# Patient Record
Sex: Male | Born: 1950 | Race: Asian | Hispanic: No | Marital: Married | Smoking: Former smoker
Health system: Southern US, Community
[De-identification: ages and names within clinical notes are randomized; demographics above are authoritative.]

## PROBLEM LIST (undated history)

## (undated) DIAGNOSIS — N186 End stage renal disease: Secondary | ICD-10-CM

## (undated) DIAGNOSIS — N189 Chronic kidney disease, unspecified: Secondary | ICD-10-CM

## (undated) DIAGNOSIS — Z720 Tobacco use: Secondary | ICD-10-CM

## (undated) DIAGNOSIS — I10 Essential (primary) hypertension: Secondary | ICD-10-CM

## (undated) DIAGNOSIS — D649 Anemia, unspecified: Secondary | ICD-10-CM

## (undated) HISTORY — PX: NO PAST SURGERIES: SHX2092

## (undated) HISTORY — PX: EYE SURGERY: SHX253

---

## 2017-08-06 ENCOUNTER — Other Ambulatory Visit: Payer: Self-pay

## 2017-08-06 ENCOUNTER — Emergency Department (HOSPITAL_COMMUNITY): Payer: Medicare HMO

## 2017-08-06 ENCOUNTER — Encounter (HOSPITAL_COMMUNITY): Payer: Self-pay

## 2017-08-06 ENCOUNTER — Inpatient Hospital Stay (HOSPITAL_COMMUNITY)
Admission: EM | Admit: 2017-08-06 | Discharge: 2017-08-07 | DRG: 683 | Disposition: A | Discharge status: Home / Self Care | Payer: Medicare HMO | Attending: Internal Medicine | Admitting: Internal Medicine

## 2017-08-06 DIAGNOSIS — N179 Acute kidney failure, unspecified: Principal | ICD-10-CM | POA: Diagnosis present

## 2017-08-06 DIAGNOSIS — I4891 Unspecified atrial fibrillation: Secondary | ICD-10-CM | POA: Insufficient documentation

## 2017-08-06 DIAGNOSIS — F1010 Alcohol abuse, uncomplicated: Secondary | ICD-10-CM | POA: Diagnosis present

## 2017-08-06 DIAGNOSIS — N184 Chronic kidney disease, stage 4 (severe): Secondary | ICD-10-CM | POA: Diagnosis present

## 2017-08-06 DIAGNOSIS — I129 Hypertensive chronic kidney disease with stage 1 through stage 4 chronic kidney disease, or unspecified chronic kidney disease: Secondary | ICD-10-CM | POA: Diagnosis present

## 2017-08-06 DIAGNOSIS — I4892 Unspecified atrial flutter: Secondary | ICD-10-CM | POA: Diagnosis present

## 2017-08-06 DIAGNOSIS — F17210 Nicotine dependence, cigarettes, uncomplicated: Secondary | ICD-10-CM | POA: Diagnosis present

## 2017-08-06 DIAGNOSIS — I10 Essential (primary) hypertension: Secondary | ICD-10-CM | POA: Diagnosis not present

## 2017-08-06 HISTORY — DX: Tobacco use: Z72.0

## 2017-08-06 HISTORY — DX: Essential (primary) hypertension: I10

## 2017-08-06 LAB — CBC
HCT: 38 % — ABNORMAL LOW (ref 39.0–52.0)
HEMOGLOBIN: 13.1 g/dL (ref 13.0–17.0)
MCH: 32 pg (ref 26.0–34.0)
MCHC: 34.5 g/dL (ref 30.0–36.0)
MCV: 92.7 fL (ref 78.0–100.0)
Platelets: 126 10*3/uL — ABNORMAL LOW (ref 150–400)
RBC: 4.1 MIL/uL — ABNORMAL LOW (ref 4.22–5.81)
RDW: 12.7 % (ref 11.5–15.5)
WBC: 7.5 10*3/uL (ref 4.0–10.5)

## 2017-08-06 LAB — URINALYSIS, ROUTINE W REFLEX MICROSCOPIC
BACTERIA UA: NONE SEEN
Bilirubin Urine: NEGATIVE
Glucose, UA: NEGATIVE mg/dL
Ketones, ur: NEGATIVE mg/dL
Leukocytes, UA: NEGATIVE
Nitrite: NEGATIVE
PROTEIN: 100 mg/dL — AB
SPECIFIC GRAVITY, URINE: 1.004 — AB (ref 1.005–1.030)
pH: 6 (ref 5.0–8.0)

## 2017-08-06 LAB — BASIC METABOLIC PANEL
ANION GAP: 11 (ref 5–15)
BUN: 37 mg/dL — ABNORMAL HIGH (ref 6–20)
CALCIUM: 9.3 mg/dL (ref 8.9–10.3)
CO2: 22 mmol/L (ref 22–32)
Chloride: 106 mmol/L (ref 101–111)
Creatinine, Ser: 2.71 mg/dL — ABNORMAL HIGH (ref 0.61–1.24)
GFR calc Af Amer: 26 mL/min — ABNORMAL LOW (ref >60)
GFR, EST NON AFRICAN AMERICAN: 23 mL/min — AB (ref >60)
GLUCOSE: 100 mg/dL — AB (ref 65–99)
Potassium: 4.2 mmol/L (ref 3.5–5.1)
SODIUM: 139 mmol/L (ref 135–145)

## 2017-08-06 LAB — T4, FREE: FREE T4: 1.02 ng/dL (ref 0.61–1.12)

## 2017-08-06 LAB — TROPONIN I

## 2017-08-06 LAB — TSH: TSH: 4.03 u[IU]/mL (ref 0.350–4.500)

## 2017-08-06 LAB — MAGNESIUM: Magnesium: 1.7 mg/dL (ref 1.7–2.4)

## 2017-08-06 MED ORDER — HEPARIN (PORCINE) IN NACL 100-0.45 UNIT/ML-% IJ SOLN
850.0000 [IU]/h | INTRAMUSCULAR | Status: DC
  Administered 2017-08-06: 850 [IU]/h via INTRAVENOUS
  Filled 2017-08-06: qty 250

## 2017-08-06 MED ORDER — HEPARIN BOLUS VIA INFUSION
3000.0000 [IU] | Freq: Once | INTRAVENOUS | Status: AC
  Administered 2017-08-06: 3000 [IU] via INTRAVENOUS
  Filled 2017-08-06: qty 3000

## 2017-08-06 MED ORDER — CARVEDILOL 12.5 MG PO TABS
12.5000 mg | ORAL_TABLET | Freq: Two times a day (BID) | ORAL | Status: DC
  Filled 2017-08-06: qty 1

## 2017-08-06 MED ORDER — SODIUM CHLORIDE 0.9 % IV BOLUS
1000.0000 mL | Freq: Once | INTRAVENOUS | Status: AC
  Administered 2017-08-06: 1000 mL via INTRAVENOUS

## 2017-08-06 MED ORDER — METOPROLOL TARTRATE 5 MG/5ML IV SOLN
5.0000 mg | Freq: Once | INTRAVENOUS | Status: AC
  Administered 2017-08-06: 5 mg via INTRAVENOUS
  Filled 2017-08-06: qty 5

## 2017-08-06 MED ORDER — CARVEDILOL 25 MG PO TABS
25.0000 mg | ORAL_TABLET | Freq: Two times a day (BID) | ORAL | Status: DC
  Administered 2017-08-06 – 2017-08-07 (×2): 25 mg via ORAL
  Filled 2017-08-06 (×3): qty 1

## 2017-08-06 NOTE — Consult Note (Addendum)
Cardiology Consultation:   Patient IDDalvin Leblanc; [id number]; [birth year]   Admit date: 08/06/2017 Date of Consult: 08/06/2017  Primary Care Provider: No primary care provider on file. Primary Cardiologist: David Latch, MD  Primary Electrophysiologist:     Patient Profile:   David Leblanc is a 67 y.o. male with a hx of HTN and current smoker who is being seen today for the evaluation of abnormal EKG at the request of Dr. Gilford Leblanc.  History of Present Illness:   Mr. David Leblanc has not seen a provider in over two years. At the urging of his sister, he presented for a routine physical to PCP to establish care. The PCP found him to be tachycardic and with an abnormal EKG and sent him to [facility] for further evaluation.  On arrival, he is completely asymptomatic with a heart rate in the 100-130s. EKG and telemetry with 2:1 atrial flutter. EDP ordered 5 mg IV lopressor which did not significantly slow his rate. He denies chest pain, shortness of breath, bleeding problems, orthopnea, lower extremity swelling, dizziness, recent falls and syncopal episodes.   He smokes cigarettes. He has a history of taking antihypertensives, but stopped taking his medication and following up with a provider over the past 2 years. Electrolytes are WNL, TSH high normal, Mg pending. He has an elevated creatinine to 2.71. After calling his PCP, he has had elevated creatinine in the past (? 1.9).  CXR with cardiomegaly.    Past Medical History:  Diagnosis Date  . Hypertension   . Tobacco use     History reviewed. No pertinent surgical history.   Home Medications:  Prior to Admission medications   Medication Sig Start Date End Date Taking? Authorizing Provider  Multiple Vitamin (MULTIVITAMIN WITH MINERALS) TABS tablet Take 1 tablet by mouth daily.   Yes [provider]    Inpatient Medications: Scheduled Meds:  Continuous Infusions:  PRN Meds:   Allergies:   No Known Allergies  Social  History:   Social History   Socioeconomic History  . Marital status: Not on file    Spouse name: Not on file  . Number of children: Not on file  . Years of education: Not on file  . Highest education level: Not on file  Occupational History  . Not on file  Social Needs  . Financial resource strain: Not on file  . Food insecurity:    Worry: Not on file    Inability: Not on file  . Transportation needs:    Medical: Not on file    Non-medical: Not on file  Tobacco Use  . Smoking status: Former Smoker    Packs/day: 0.50    Years: 50.00    Pack years: 25.00    Types: Cigarettes  . Smokeless tobacco: Never Used  Substance and Sexual Activity  . Alcohol use: Not on file  . Drug use: Not on file  . Sexual activity: Not on file  Lifestyle  . Physical activity:    Days per week: Not on file    Minutes per session: Not on file  . Stress: Not on file  Relationships  . Social connections:    Talks on phone: Not on file    Gets together: Not on file    Attends religious service: Not on file    Active member of club or organization: Not on file    Attends meetings of clubs or organizations: Not on file    Relationship status: Not on file  . Intimate partner  violence:    Fear of current or ex partner: Not on file    Emotionally abused: Not on file    Physically abused: Not on file    Forced sexual activity: Not on file  Other Topics Concern  . Not on file  Social History Narrative  . Not on file    Family History:    Family History  Problem Relation Age of Onset  . Hypertension Father      ROS:  Please see the history of present illness.  All other ROS reviewed and negative.     Physical Exam/Data:   Vitals:   08/06/17 1530 08/06/17 1536 08/06/17 1545 08/06/17 1547  BP: (!) 197/116 (!) 170/108 (!) 198/110 (!) 172/100  Pulse: (!) 108 (!) 49 (!) 112 (!) 50  Resp: 19 (!) 22 (!) 24 (!) 27  Temp:      TempSrc:      SpO2: 100% 99% 99% 100%  Weight:      Height:        No intake or output data in the 24 hours ending 08/06/17 1605 Filed Weights   08/06/17 1350  Weight: 128 lb (58.1 kg)   Body mass index is 20.98 kg/m.  General:  Well nourished, well developed, in no acute distress HEENT: normal Neck: no JVD Vascular: No carotid bruits Cardiac:  Irregular rhythm, tachycardic rate Lungs:  clear to auscultation bilaterally, no wheezing, rhonchi or rales  Abd: soft, nontender, no hepatomegaly  Ext: no edema Musculoskeletal:  No deformities, BUE and BLE strength normal and equal Skin: warm and dry  Neuro:  CNs 2-12 intact, no focal abnormalities noted Psych:  Normal affect   EKG:  The EKG was personally reviewed and demonstrates:  Atrial flutter with RVR Telemetry:  Telemetry was personally reviewed and demonstrates:  Aflutter  Relevant CV Studies:  Echo pending  Laboratory Data:  Chemistry Recent Labs  Lab 08/06/17 1436  NA 139  K 4.2  CL 106  CO2 22  GLUCOSE 100*  BUN 37*  CREATININE 2.71*  CALCIUM 9.3  GFRNONAA 23*  GFRAA 26*  ANIONGAP 11    No results for input(s): PROT, ALBUMIN, AST, ALT, ALKPHOS, BILITOT in the last 168 hours. Hematology Recent Labs  Lab 08/06/17 1436  WBC 7.5  RBC 4.10*  HGB 13.1  HCT 38.0*  MCV 92.7  MCH 32.0  MCHC 34.5  RDW 12.7  PLT 126*   Cardiac Enzymes Recent Labs  Lab 08/06/17 1427  TROPONINI <0.03   No results for input(s): TROPIPOC in the last 168 hours.  BNPNo results for input(s): BNP, PROBNP in the last 168 hours.  DDimer No results for input(s): DDIMER in the last 168 hours.  Radiology/Studies:  No results found.  Assessment and Plan:   1. Atrial flutter with RVR - pt presents with atrial flutter with RVR - he is asymptomatic and tolerating this rhythm well, HDS - unknown how long he has been in this rhythm - K and TSH normal - Mg pending - will order echocardiogram - this will guide HTN medications as well - This patients CHA2DS2-VASc Score (is at least 2) and  unadjusted Ischemic Stroke Rate (% per year) is equal to  2.2 % stroke rate/year from a score of 2 (HTN, age) - will start heparin drip - pt has since converted to NSR following IV lopressor x 1 - in anticipation of hypertensive heart disease and cardiomyopathy, will start coreg 25 mg BID - pending echo results, may need  an ischemic workup   2. Essential hypertension - pressures in the 190s - 5 mg IV lopressor dropped pressure initially to 013H systolic - will avoid ACEI/ARB during workup of CKD, as below   3. Suspect acute on chronic kidney disease - baseline creatinine is unknown - per EDP who called PCP office, two years ago his creatinine was 1.9 - avoid nephrotoxic agents  Recommend admission for overnight observation to make sure he remains in NSR or rate-controlled on current regimen. His reduced kidney function may make anticoagulation challenging. If kidney function improves, may be a candidate for NOAC. Cardiology will follow.   For questions or updates, please contact [facility] Please consult [email or web address] for contact info under Cardiology/STEMI.   Signed, Tami Lin Anguel Delapena, PA  08/06/2017 4:05 PM

## 2017-08-06 NOTE — ED Notes (Signed)
CARDIO PA Provider at bedside. WITNESSED LOPRESSOR MEDICATION ADMINISTRATION. REQUESTING EKG

## 2017-08-06 NOTE — ED Notes (Signed)
ED Provider at bedside. [place] AT BEDSIDE SPEAKING TO PT AND FAMILY

## 2017-08-06 NOTE — ED Triage Notes (Addendum)
Patient presents from PCP's office for yearly check up. Patient reports EKG showed "irregular heart rhythm" and patient was encouraged to go to [facility] for further work up. Patient denies CP/SOB. Patient AxOx4 in triage. Patient denies heart history. EKG in triage shows Atrial Fibrillation with RVR.

## 2017-08-06 NOTE — Progress Notes (Signed)
ANTICOAGULATION CONSULT NOTE - Initial Consult  Pharmacy Consult for Heparin Indication: atrial fibrillation  No Known Allergies  Patient Measurements: Height: 5' 5.5" (166.4 cm) Weight: 128 lb (58.1 kg) IBW/kg (Calculated) : 62.65 Heparin Dosing Weight: 58 kg  Vital Signs: Temp: 98.3 F (36.8 C) (04/18 1350) Temp Source: Oral (04/18 1350) BP: 182/100 (04/18 1630) Pulse Rate: 89 (04/18 1630)  Labs: Recent Labs    08/06/17 1427 08/06/17 1436  HGB  --  13.1  HCT  --  38.0*  PLT  --  126*  CREATININE  --  2.71*  TROPONINI <0.03  --     Estimated Creatinine Clearance: 21.4 mL/min (A) (by C-G formula based on SCr of 2.71 mg/dL (H)).   Medical History: Past Medical History:  Diagnosis Date  . Hypertension   . Tobacco use      Assessment: 65 YOM presents to PCP visit (establishing care as last time saw physician was ~2y ago) and found to be tachycardic w/ abnormal EKG. Sent to [facility] ED and found to have aflutter with RVR. Pharmacy asked to dose heparin gtt.    Today, 08/06/2017  CBC: Hgb WNL, platelets mildly decreased (no previous labs seen in [facility])  Renal: SCr elevated  No reportedly on antithrombotics prior to admission   Goal of Therapy:  Heparin level 0.3-0.7 units/ml Monitor platelets by anticoagulation protocol: Yes   Plan:   Heparin 3000 unit bolus then 850 units/hr infusion   8h heparin level (also check INR)  Daily heparin level and CBC  Monitor platelet count  Monitor for bleeding  Await plan for long-term anticoagulation for stroke prevention   DOAC not best option based on current renal fx at time of presentation   Doreene Eland, PharmD, BCPS.   Pager: [phone] 08/06/2017 5:13 PM

## 2017-08-06 NOTE — ED Notes (Signed)
ADMISSION RN AT BEDSIDE 

## 2017-08-06 NOTE — ED Notes (Signed)
Patient transported to X-ray 

## 2017-08-06 NOTE — ED Notes (Signed)
NSR EKG GIVEN TO EDPA EMILY SHROSBREE. NO ADDITIONAL ORDERS GIVEN

## 2017-08-06 NOTE — ED Notes (Signed)
Bed: YY34 Expected date:  Expected time:  Means of arrival:  Comments: TRI 4

## 2017-08-06 NOTE — ED Provider Notes (Signed)
[facility] DEPT Provider Note   CSN: [id number] Arrival date & time: 08/06/17  1342     History   Chief Complaint Chief Complaint  Patient presents with  . Irregular Heart Beat    HPI Abdimalik Rica Koyanagi is a 67 y.o. male.  HPI  [name] is a 67 year old male with history of hypertension and tobacco use who presents to the emergency department from his PCP office for evaluation of irregular heart rhythm.  Patient reports that he is otherwise in his normal state of health and feels baseline, but was told to come here for irregular heart rhythm.  He denies chest pain, shortness of breath, palpitations, leg swelling, recent illness with cough, fever, sore throat or congestion, lightheadedness or syncope.  States that he has never been diagnosed with irregular heartbeat in the past.  Drinks about 1 cup of coffee per day.  Also states that he has 2 shots of alcohol per day, denies any recent increase in alcohol consumption or recreational drug use.  States that he has not taken antihypertensives in two years now, nor does he take any medication regularly other than a multivitamin.  He denies history of exertional chest pain.  Reports that his father had a heart attack at age 40, no other family history of ischemic heart disease.  Past Medical History:  Diagnosis Date  . Hypertension     There are no active problems to display for this patient.   History reviewed. No pertinent surgical history.      Home Medications    Prior to Admission medications   Not on File    Family History No family history on file.  Social History Social History   Tobacco Use  . Smoking status: Former Smoker    Packs/day: 0.50    Years: 50.00    Pack years: 25.00    Types: Cigarettes  . Smokeless tobacco: Never Used  Substance Use Topics  . Alcohol use: Not on file  . Drug use: Not on file     Allergies   Patient has no known allergies.   Review of  Systems Review of Systems  Constitutional: Negative for chills and fever.  HENT: Negative for congestion, ear pain and sore throat.   Eyes: Negative for visual disturbance.  Respiratory: Negative for cough and shortness of breath.   Cardiovascular: Negative for chest pain, palpitations and leg swelling.  Gastrointestinal: Negative for abdominal pain, nausea and vomiting.  Genitourinary: Negative for difficulty urinating.  Musculoskeletal: Negative for gait problem.  Skin: Negative for rash.  Neurological: Negative for dizziness, weakness, light-headedness, numbness and headaches.  Psychiatric/Behavioral: Negative for agitation.     Physical Exam Updated Vital Signs BP (!) 162/90 (BP Location: Left Arm)   Pulse 92   Temp 98.3 F (36.8 C) (Oral)   Resp (!) 22   Ht 5' 5.5" (1.664 m)   Wt 58.1 kg (128 lb)   SpO2 100%   BMI 20.98 kg/m   Physical Exam  Constitutional: He is oriented to person, place, and time. He appears well-developed and well-nourished. No distress.  Sitting at bedside in no apparent distress. Non-toxic.   HENT:  Head: Normocephalic and atraumatic.  Mouth/Throat: Oropharynx is clear and moist. No oropharyngeal exudate.  Eyes: Pupils are equal, round, and reactive to light. Right eye exhibits no discharge. Left eye exhibits no discharge.  Neck: Normal range of motion. Neck supple. No JVD present. No tracheal deviation present.  Cardiovascular:  No murmur heard. Tachycardic,  irregular rhythm  Pulmonary/Chest: Effort normal and breath sounds normal. No stridor. No respiratory distress. He has no wheezes. He has no rales.  Abdominal: Soft. Bowel sounds are normal. There is no tenderness. There is no guarding.  Musculoskeletal:  No leg swelling.   Neurological: He is alert and oriented to person, place, and time. Coordination normal.  Skin: Skin is warm and dry. Capillary refill takes less than 2 seconds. He is not diaphoretic.  Psychiatric: He has a normal mood  and affect. His behavior is normal.  Nursing note and vitals reviewed.    ED Treatments / Results  Labs (all labs ordered are listed, but only abnormal results are displayed) Labs Reviewed  BASIC METABOLIC PANEL  CBC  TROPONIN I  TSH  T4, FREE    EKG EKG Interpretation  Date/Time:  Thursday August 06 2017 13:54:05 EDT Ventricular Rate:  128 PR Interval:    QRS Duration: 102 QT Interval:  323 QTC Calculation: 472 R Axis:   -62 Text Interpretation:  Sinus tachycardia with irregular rate LAD, consider left anterior fascicular block LVH with secondary repolarization abnormality No old tracing to compare Confirmed by Isla Pence [phone]) on 08/06/2017 2:25:11 PM   Radiology No results found.  Procedures Procedures (including critical care time)  Medications Ordered in ED Medications  carvedilol (COREG) tablet 25 mg (25 mg Oral Given 08/06/17 1807)  heparin ADULT infusion 100 units/mL (25000 units/215mL sodium chloride 0.45%) (850 Units/hr Intravenous Canceled Entry 08/06/17 1821)  metoprolol tartrate (LOPRESSOR) injection 5 mg (5 mg Intravenous Given 08/06/17 1533)  sodium chloride 0.9 % bolus 1,000 mL (0 mLs Intravenous Stopped 08/06/17 1602)  heparin bolus via infusion 3,000 Units (3,000 Units Intravenous Bolus from Bag 08/06/17 1816)     Initial Impression / Assessment and Plan / ED Course  I have reviewed the triage vital signs and the nursing notes.  Pertinent labs & imaging results that were available during my care of the patient were reviewed by me and considered in my medical decision making (see chart for details).     Patient presents with irregular heartbeat from PCP office.  He denies any symptoms.  He is initially tachycardic with heart rate 120-140bpm, rhythm irregular.   EKG with irregular rhythm, p-waves present. He is otherwise hemodynamically stable. Blood pressure 162/90. IV fluids and metoprolol ordered. Discussed this patient with Dr. Gilford Raid who  agrees with this plan. Plan to get basic labs including troponin and cardiology consult.   BMP without any major electrolyte abnormalities.  His creatinine is elevated at 2.71.  Contacted patient's prior PCP who reports his creatinine was 1.98 in 2017.  Suspect that this is a chronic problem related to his untreated hypertension.  CBC without acute abnormality.  Troponin negative, EKG nonischemic and given presentation do not suspect ACS.  Cardiology consulted.  PA Fabian Sharp saw the patient and will admit. She suggests starting heparin.   Several hours later patient continues to be in the ER with bed request pending. No H&P in the chart, only consult note by cardiology. There was an error in communication, apparently Cardiology would like medicine to admit. Hospitalist service consulted. Spoke with Hospitalist Dr. Maudie Mercury who will admit patient.   Final Clinical Impressions(s) / ED Diagnoses   Final diagnoses:  None    ED Discharge Orders    None       Bernarda Caffey 08/07/17 Dreama Saa, MD 08/14/17 [phone]

## 2017-08-07 ENCOUNTER — Inpatient Hospital Stay (HOSPITAL_COMMUNITY): Payer: Medicare HMO

## 2017-08-07 ENCOUNTER — Encounter (HOSPITAL_COMMUNITY): Payer: Self-pay | Admitting: Internal Medicine

## 2017-08-07 DIAGNOSIS — N184 Chronic kidney disease, stage 4 (severe): Secondary | ICD-10-CM

## 2017-08-07 DIAGNOSIS — I129 Hypertensive chronic kidney disease with stage 1 through stage 4 chronic kidney disease, or unspecified chronic kidney disease: Secondary | ICD-10-CM | POA: Diagnosis present

## 2017-08-07 DIAGNOSIS — I4892 Unspecified atrial flutter: Secondary | ICD-10-CM | POA: Diagnosis present

## 2017-08-07 DIAGNOSIS — N179 Acute kidney failure, unspecified: Secondary | ICD-10-CM | POA: Diagnosis present

## 2017-08-07 DIAGNOSIS — F17210 Nicotine dependence, cigarettes, uncomplicated: Secondary | ICD-10-CM | POA: Diagnosis present

## 2017-08-07 DIAGNOSIS — F1010 Alcohol abuse, uncomplicated: Secondary | ICD-10-CM | POA: Diagnosis present

## 2017-08-07 DIAGNOSIS — I10 Essential (primary) hypertension: Secondary | ICD-10-CM | POA: Diagnosis not present

## 2017-08-07 LAB — ECHOCARDIOGRAM COMPLETE
Height: 65.5 in
Weight: 2049.6 oz

## 2017-08-07 LAB — CBC
HEMATOCRIT: 35.3 % — AB (ref 39.0–52.0)
HEMOGLOBIN: 12.1 g/dL — AB (ref 13.0–17.0)
MCH: 31.9 pg (ref 26.0–34.0)
MCHC: 34.3 g/dL (ref 30.0–36.0)
MCV: 93.1 fL (ref 78.0–100.0)
Platelets: 122 10*3/uL — ABNORMAL LOW (ref 150–400)
RBC: 3.79 MIL/uL — AB (ref 4.22–5.81)
RDW: 12.8 % (ref 11.5–15.5)
WBC: 6.1 10*3/uL (ref 4.0–10.5)

## 2017-08-07 LAB — COMPREHENSIVE METABOLIC PANEL
ALT: 25 U/L (ref 17–63)
ANION GAP: 8 (ref 5–15)
AST: 23 U/L (ref 15–41)
Albumin: 3.3 g/dL — ABNORMAL LOW (ref 3.5–5.0)
Alkaline Phosphatase: 58 U/L (ref 38–126)
BUN: 33 mg/dL — ABNORMAL HIGH (ref 6–20)
CHLORIDE: 109 mmol/L (ref 101–111)
CO2: 22 mmol/L (ref 22–32)
CREATININE: 2.51 mg/dL — AB (ref 0.61–1.24)
Calcium: 8.9 mg/dL (ref 8.9–10.3)
GFR, EST AFRICAN AMERICAN: 29 mL/min — AB (ref >60)
GFR, EST NON AFRICAN AMERICAN: 25 mL/min — AB (ref >60)
Glucose, Bld: 110 mg/dL — ABNORMAL HIGH (ref 65–99)
Potassium: 4.5 mmol/L (ref 3.5–5.1)
Sodium: 139 mmol/L (ref 135–145)
Total Bilirubin: 1.3 mg/dL — ABNORMAL HIGH (ref 0.3–1.2)
Total Protein: 6.7 g/dL (ref 6.5–8.1)

## 2017-08-07 LAB — HEPARIN LEVEL (UNFRACTIONATED)
HEPARIN UNFRACTIONATED: 0.54 [IU]/mL (ref 0.30–0.70)
Heparin Unfractionated: 0.2 IU/mL — ABNORMAL LOW (ref 0.30–0.70)

## 2017-08-07 LAB — SODIUM, URINE, RANDOM: SODIUM UR: 35 mmol/L

## 2017-08-07 LAB — PROTIME-INR
INR: 1.16
PROTHROMBIN TIME: 14.7 s (ref 11.4–15.2)

## 2017-08-07 LAB — TROPONIN I: Troponin I: <0.03 ng/mL (ref <0.03)

## 2017-08-07 MED ORDER — AMLODIPINE BESYLATE 5 MG PO TABS
5.0000 mg | ORAL_TABLET | Freq: Every day | ORAL | Status: DC
  Administered 2017-08-07: 5 mg via ORAL
  Filled 2017-08-07: qty 1

## 2017-08-07 MED ORDER — APIXABAN 2.5 MG PO TABS
2.5000 mg | ORAL_TABLET | Freq: Two times a day (BID) | ORAL | Status: DC
  Administered 2017-08-07: 2.5 mg via ORAL
  Filled 2017-08-07: qty 1

## 2017-08-07 MED ORDER — APIXABAN 2.5 MG PO TABS: 2.5000 mg | ORAL_TABLET | Freq: Two times a day (BID) | ORAL | 0 refills | Status: DC

## 2017-08-07 MED ORDER — ACETAMINOPHEN 650 MG RE SUPP: 650.0000 mg | Freq: Four times a day (QID) | RECTAL | Status: DC | PRN

## 2017-08-07 MED ORDER — CARVEDILOL 25 MG PO TABS: 25.0000 mg | ORAL_TABLET | Freq: Two times a day (BID) | ORAL | 0 refills | Status: DC

## 2017-08-07 MED ORDER — ACETAMINOPHEN 325 MG PO TABS: 650.0000 mg | ORAL_TABLET | Freq: Four times a day (QID) | ORAL | Status: DC | PRN

## 2017-08-07 MED ORDER — SODIUM CHLORIDE 0.9 % IV SOLN
INTRAVENOUS | Status: AC
  Administered 2017-08-07: 03:00:00 via INTRAVENOUS

## 2017-08-07 MED ORDER — AMLODIPINE BESYLATE 5 MG PO TABS: 5.0000 mg | ORAL_TABLET | Freq: Every day | ORAL | 0 refills | Status: DC

## 2017-08-07 NOTE — Progress Notes (Signed)
Pt received Eliquis card and instructions from Pharm D.

## 2017-08-07 NOTE — Discharge Summary (Signed)
Physician Discharge Summary  David Leblanc NIO:[id number] DOB: 05/08/1950 DOA: 08/06/2017  PCP: System, Pcp Not In  Admit date: 08/06/2017 Discharge date: 08/07/2017  Admitted From: Home Disposition: Home  Discharge Condition:Stable CODE STATUS:Full Diet recommendation: Heart Healthy   Brief/Interim Summary:  Admission H and P:  David Leblanc  is a 67 y.o. male,w hypertension apparently went to pcp for a routine exam and was noted to be in Afib with RVR w palpitations.  Pt presented to ED for evaluation and received metoprolol iv without improvement. And then had spontaneous conversion to NSR as well as ARF on CRF.    In Ed,  Trop <0.03 Magnesium 1.7 Na 139, K 4.2, Bun 37, creatinine 2.71 Wbc 7.5, Hgb 13.1, Plt 126 TSH 4.030 Urinalysis wbc 0--5 Pt will be admitted for aflutter w RVR.  Hospital course:  Patient was noted to be in atrial flutter with RVR on presentation.  Patient denies any past medical history.  He has not seen a doctor for about 2 years.  He converted to normal sinus rhythm after he was started on carvedilol 25 mg twice a day.  He was also initially started on heparin drip .Cardiology was consulted. Patient was also found to have abnormal kidney function.  Could be acute kidney injury versus chronic kidney disease.  Since he presented with hypertension and he was not taking any medications at home, we presume that this could be CKD associated  with uncontrolled hypertension.  We cannot find baseline creatinine on his chart and he denies any history of kidney problems in the past. Patient seen and examined this morning.  Remains very comfortable.  He converted to normal sinus rhythm after starting on carvedilol.  His blood pressure this morning is stable. He underwent echocardiogram which showed normal left jugular size and function, grade 2 diastolic dysfunction.  Cardiology cleared him for discharge. Heparin drip discontinued and he was started on Eliquis 2.5 mg  daily. Since patient rapidly improved overnight, he is stable for discharge today.  He should follow-up with cardiology outpatient.  He is also follow-up with nephrology as an outpatient in 2 weeks.  We recommend  to get a BMP test in a week to check his kidney function.  Following problems were addressed during hospitalization:  Afib/flutter with RVR: Currently normal sinus rhythm.  Troponin negative.  Echocardiography showed normal left ventricle size and function, grade 2 diastolic dysfunction. Initially started on heparin drip which has been discontinued. Continue Coreg.  Continue Eliquis which has been started here Case manager consulted for providing coupon for Eliquis.  Acute kidney injury on CKD versus CKD: Baseline creatinine unknown.  Likely chronic kidney disease associated with uncontrolled hypertension. Follow-up with nephrology as an outpatient. Kidney function slightly improved this morning with IV fluids. Renal ultrasound showed mildly increased renal parenchymal echogenicity may reflect medical renal disease.  Hypertension: Currently blood pressure stable.  Continue carvedilol and amlodipine.  Discharge Diagnoses:  Principal Problem:   Atrial flutter ([place]) Active Problems:   Acute renal failure superimposed on stage 4 chronic kidney disease (HCC)   Essential hypertension   Atrial flutter with rapid ventricular response [facility])    Discharge Instructions  Discharge Instructions    Diet - low sodium heart healthy   Complete by:  As directed    Discharge instructions   Complete by:  As directed    1) Please follow up with your PCP/cardiology as an outpatient in a week. 2) Do a BMP to check kidney function in a week  during the follow-up. 3) Follow up with nephrology in 2 weeks.  Name and number of the provider has been attached. 4) Take prescribed medications as instructed.   Increase activity slowly   Complete by:  As directed      Allergies as of 08/07/2017   No  Known Allergies     Medication List    TAKE these medications   amLODipine 5 MG tablet Commonly known as:  NORVASC Take 1 tablet (5 mg total) by mouth daily. Start taking on:  08/08/2017   apixaban 2.5 MG Tabs tablet Commonly known as:  ELIQUIS Take 1 tablet (2.5 mg total) by mouth 2 (two) times daily.   carvedilol 25 MG tablet Commonly known as:  COREG Take 1 tablet (25 mg total) by mouth 2 (two) times daily with a meal.   multivitamin with minerals Tabs tablet Take 1 tablet by mouth daily.      Follow-up Information    Josue Hector, MD Follow up on 08/28/2017.   Specialty:  Cardiology Why:  at 1:45 PM with  Lewayne Bunting, NP Contact information: [address] [place] [address] [phone]        Rosita Fire, MD. Schedule an appointment as soon as possible for a visit in 2 week(s).   Specialty:  Internal Medicine Contact information: [place] [address] [phone]          No Known Allergies  Consultations:  Cardiology   Procedures/Studies: Dg Chest 2 View  Result Date: 08/06/2017 CLINICAL DATA:  Yearly checkup. Atrial fibrillation. Irregular heartbeat. EXAM: CHEST - 2 VIEW COMPARISON:  None. FINDINGS: Cardiomegaly. Aortic atherosclerosis. Clear lung fields. No bony abnormality. IMPRESSION: Cardiomegaly.  No active disease. Electronically Signed   By: Staci Righter M.D.   On: 08/06/2017 16:05   US Renal  Result Date: 08/07/2017 CLINICAL DATA:  Acute onset of renal failure. EXAM: RENAL / URINARY TRACT ULTRASOUND COMPLETE COMPARISON:  None. FINDINGS: Right Kidney: Length: 8.7 cm. Mildly increased parenchymal echogenicity is noted. Several right renal cysts are seen, measuring up to 2.1 cm in size. One of these appears septated. No hydronephrosis visualized. Left Kidney: Length: 8.8 cm. Mildly increased parenchymal echogenicity is noted. Several left renal cysts are seen, measuring up to 1.2 cm in size. No  hydronephrosis visualized. Bladder: Appears normal for degree of bladder distention. IMPRESSION: 1. No evidence of hydronephrosis. 2. Mildly increased renal parenchymal echogenicity may reflect medical renal disease. 3. Bilateral renal cysts noted. Electronically Signed   By: Garald Balding M.D.   On: 08/07/2017 04:21    echo  Subjective: Patient seen and examined the patient this morning.  He was comfortable.  Hemodynamically stable.  Stable for discharge today.  Discharge Exam: Vitals:   08/07/17 0239 08/07/17 1334  BP: (!) 168/79 (!) 141/72  Pulse: 70 76  Resp: 12 16  Temp: 98.4 F (36.9 C) 99.2 F (37.3 C)  SpO2: 98% 99%   Vitals:   08/07/17 0100 08/07/17 0130 08/07/17 0239 08/07/17 1334  BP: (!) 164/89 (!) 162/86 (!) 168/79 (!) 141/72  Pulse: 72 70 70 76  Resp: 14 13 12 16   Temp:   98.4 F (36.9 C) 99.2 F (37.3 C)  TempSrc:   Oral Oral  SpO2: 100% 100% 98% 99%  Weight:   58.1 kg (128 lb 1.6 oz)   Height:   5' 5.5" (1.664 m)     General: Pt is alert, awake, not in acute distress Cardiovascular: RRR,  S1/S2 +, no rubs, no gallops Respiratory: CTA bilaterally, no wheezing, no rhonchi Abdominal: Soft, NT, ND, bowel sounds + Extremities: no edema, no cyanosis    The results of significant diagnostics from this hospitalization (including imaging, microbiology, ancillary and laboratory) are listed below for reference.     Microbiology: No results found for this or any previous visit (from the past 240 hour(s)).   Labs: BNP (last 3 results) No results for input(s): BNP in the last 8760 hours. Basic Metabolic Panel: Recent Labs  Lab 08/06/17 1434 08/06/17 1436 08/07/17 0233  NA  --  139 139  K  --  4.2 4.5  CL  --  106 109  CO2  --  22 22  GLUCOSE  --  100* 110*  BUN  --  37* 33*  CREATININE  --  2.71* 2.51*  CALCIUM  --  9.3 8.9  MG 1.7  --   --    Liver Function Tests: Recent Labs  Lab 08/07/17 0233  AST 23  ALT 25  ALKPHOS 58  BILITOT 1.3*   PROT 6.7  ALBUMIN 3.3*   No results for input(s): LIPASE, AMYLASE in the last 168 hours. No results for input(s): AMMONIA in the last 168 hours. CBC: Recent Labs  Lab 08/06/17 1436 08/07/17 0233  WBC 7.5 6.1  HGB 13.1 12.1*  HCT 38.0* 35.3*  MCV 92.7 93.1  PLT 126* 122*   Cardiac Enzymes: Recent Labs  Lab 08/06/17 1427 08/07/17 0235 08/07/17 0921  TROPONINI <0.03 <0.03 <0.03   BNP: Invalid input(s): POCBNP CBG: No results for input(s): GLUCAP in the last 168 hours. D-Dimer No results for input(s): DDIMER in the last 72 hours. Hgb A1c No results for input(s): HGBA1C in the last 72 hours. Lipid Profile No results for input(s): CHOL, HDL, LDLCALC, TRIG, CHOLHDL, LDLDIRECT in the last 72 hours. Thyroid function studies Recent Labs    08/06/17 1436  TSH 4.030   Anemia work up No results for input(s): VITAMINB12, FOLATE, FERRITIN, TIBC, IRON, RETICCTPCT in the last 72 hours. Urinalysis    Component Value Date/Time   COLORURINE STRAW (A) 08/06/2017 1619   APPEARANCEUR CLEAR 08/06/2017 1619   LABSPEC 1.004 (L) 08/06/2017 1619   PHURINE 6.0 08/06/2017 1619   GLUCOSEU NEGATIVE 08/06/2017 1619   HGBUR SMALL (A) 08/06/2017 1619   BILIRUBINUR NEGATIVE 08/06/2017 1619   KETONESUR NEGATIVE 08/06/2017 1619   PROTEINUR 100 (A) 08/06/2017 1619   NITRITE NEGATIVE 08/06/2017 1619   LEUKOCYTESUR NEGATIVE 08/06/2017 1619   Sepsis Labs Invalid input(s): PROCALCITONIN,  WBC,  LACTICIDVEN Microbiology No results found for this or any previous visit (from the past 240 hour(s)).   Time coordinating discharge: 35 minutes  SIGNED:   Shelly Coss, MD  [facility] 08/07/2017, 2:27 PM Pager [phone]  If 7PM-7AM, please contact night-coverage [email or web address] Password [email or web address]

## 2017-08-07 NOTE — Progress Notes (Signed)
Progress Note  Patient Name: David Leblanc Date of Encounter: 08/07/2017  Primary Cardiologist: Skeet Latch, MD   Subjective   No chest pain, no SOB family with pt.   Inpatient Medications    Scheduled Meds: . amLODipine  5 mg Oral Daily  . carvedilol  25 mg Oral BID WC   Continuous Infusions: . sodium chloride 75 mL/hr at 08/07/17 0300  . heparin 850 Units/hr (08/06/17 1819)   PRN Meds: acetaminophen **OR** acetaminophen   Vital Signs    Vitals:   08/07/17 0030 08/07/17 0100 08/07/17 0130 08/07/17 0239  BP: (!) 160/86 (!) 164/89 (!) 162/86 (!) 168/79  Pulse: 66 72 70 70  Resp: 18 14 13 12   Temp:    98.4 F (36.9 C)  TempSrc:    Oral  SpO2: 99% 100% 100% 98%  Weight:    128 lb 1.6 oz (58.1 kg)  Height:    5' 5.5" (1.664 m)    Intake/Output Summary (Last 24 hours) at 08/07/2017 0958 Last data filed at 08/07/2017 0754 Gross per 24 hour  Intake 1244.31 ml  Output 100 ml  Net 1144.31 ml   Filed Weights   08/06/17 1350 08/07/17 0239  Weight: 128 lb (58.1 kg) 128 lb 1.6 oz (58.1 kg)    Telemetry    SR  - Personally Reviewed  ECG    No new - Personally Reviewed  Physical Exam   GEN: No acute distress.   Neck: No JVD Cardiac: RRR, no murmurs, rubs, or gallops.  Respiratory: breath sounds to auscultation bilaterally few wheezes. GI: Soft, nontender, non-distended  MS: No edema; No deformity. Neuro:  Nonfocal  Psych: Normal affect   Labs    Chemistry Recent Labs  Lab 08/06/17 1436 08/07/17 0233  NA 139 139  K 4.2 4.5  CL 106 109  CO2 22 22  GLUCOSE 100* 110*  BUN 37* 33*  CREATININE 2.71* 2.51*  CALCIUM 9.3 8.9  PROT  --  6.7  ALBUMIN  --  3.3*  AST  --  23  ALT  --  25  ALKPHOS  --  58  BILITOT  --  1.3*  GFRNONAA 23* 25*  GFRAA 26* 29*  ANIONGAP 11 8     Hematology Recent Labs  Lab 08/06/17 1436 08/07/17 0233  WBC 7.5 6.1  RBC 4.10* 3.79*  HGB 13.1 12.1*  HCT 38.0* 35.3*  MCV 92.7 93.1  MCH 32.0 31.9  MCHC 34.5  34.3  RDW 12.7 12.8  PLT 126* 122*    Cardiac Enzymes Recent Labs  Lab 08/06/17 1427 08/07/17 0235  TROPONINI <0.03 <0.03   No results for input(s): TROPIPOC in the last 168 hours.   BNPNo results for input(s): BNP, PROBNP in the last 168 hours.   DDimer No results for input(s): DDIMER in the last 168 hours.   Radiology    Dg Chest 2 View  Result Date: 08/06/2017 CLINICAL DATA:  Yearly checkup. Atrial fibrillation. Irregular heartbeat. EXAM: CHEST - 2 VIEW COMPARISON:  None. FINDINGS: Cardiomegaly. Aortic atherosclerosis. Clear lung fields. No bony abnormality. IMPRESSION: Cardiomegaly.  No active disease. Electronically Signed   By: Staci Righter M.D.   On: 08/06/2017 16:05   US Renal  Result Date: 08/07/2017 CLINICAL DATA:  Acute onset of renal failure. EXAM: RENAL / URINARY TRACT ULTRASOUND COMPLETE COMPARISON:  None. FINDINGS: Right Kidney: Length: 8.7 cm. Mildly increased parenchymal echogenicity is noted. Several right renal cysts are seen, measuring up to 2.1 cm in size. One  of these appears septated. No hydronephrosis visualized. Left Kidney: Length: 8.8 cm. Mildly increased parenchymal echogenicity is noted. Several left renal cysts are seen, measuring up to 1.2 cm in size. No hydronephrosis visualized. Bladder: Appears normal for degree of bladder distention. IMPRESSION: 1. No evidence of hydronephrosis. 2. Mildly increased renal parenchymal echogenicity may reflect medical renal disease. 3. Bilateral renal cysts noted. Electronically Signed   By: Garald Balding M.D.   On: 08/07/2017 04:21    Cardiac Studies   Echo pending--has been done  Patient Profile     67 y.o. male with a hx of HTN -uncontrolled in ER and current smoker now admitted with a flutter and converted to SR.  Also with acute on chronic renal insuff.     Assessment & Plan    A. Flutter RVR   --maintaining SR on coreg 25 BID  --on IV heparin, change to Xarelto today depending on echo results. --echo  has been done not yet read.  --CHA2DS2-VASc Score 2 --Thyroid normal   HTN -elevated 168/79 today recheck if still elevated ? Add amlodipine  AKI --per IM,  Cr down to 2.51  CKD-4  -renal u/s with renal cysts    For questions or updates, please contact [facility] Please consult [email or web address] for contact info under Cardiology/STEMI.      Signed, Cecilie Kicks, NP  08/07/2017, 9:58 AM

## 2017-08-07 NOTE — H&P (Signed)
TRH H&P   Patient Demographics:    David Leblanc, is a 67 y.o. male  MRN: [id number]   DOB - 10-17-1950  Admit Date - 08/06/2017  Outpatient Primary MD for the patient is System, [place] Not In  Referring MD/NP/PA:  Isla Pence  Outpatient Specialists:     Patient coming from:   home  Chief Complaint  Patient presents with  . Irregular Heart Beat      HPI:    David Leblanc  is a 67 y.o. male,w hypertension apparently went to pcp for a routine exam and was noted to be in Afib with RVR w palpitations.  Pt presented to ED for evaluation and received metoprolol iv without improvement. And then had spontaneous conversion to NSR as well as ARF on CRF.    In Ed,  Trop <0.03 Magnesium 1.7 Na 139, K 4.2, Bun 37, creatinine 2.71 Wbc 7.5, Hgb 13.1, Plt 126 TSH 4.030 Urinalysis wbc 0--5   Pt will be admitted for aflutter w RVR.    Review of systems:    In addition to the HPI above,   No Fever-chills, No Headache, No changes with Vision or hearing, No problems swallowing food or Liquids, No Chest pain, Cough or Shortness of Breath, No Abdominal pain, No Nausea or Vommitting, Bowel movements are regular, No Blood in stool or Urine, No dysuria, No new skin rashes or bruises, No new joints pains-aches,  No new weakness, tingling, numbness in any extremity, No recent weight gain or loss, No polyuria, polydypsia or polyphagia, No significant Mental Stressors.  A full 10 point Review of Systems was done, except as stated above, all other Review of Systems were negative.   With Past History of the following :    Past Medical History:  Diagnosis Date  . Hypertension   . Tobacco use       Past Surgical History:  Procedure Laterality Date  . NO PAST SURGERIES        Social History:     Social History   Tobacco Use  . Smoking status: Current Every Day Smoker      Packs/day: 0.25    Years: 40.00    Pack years: 10.00    Types: Cigarettes  . Smokeless tobacco: Never Used  Substance Use Topics  . Alcohol use: Yes    Comment: 2 shots per day     Lives - at home  Mobility - walks by self    Family History :     Family History  Problem Relation Age of Onset  . Hypertension Father       Home Medications:   Prior to Admission medications   Medication Sig Start Date End Date Taking? Authorizing Provider  Multiple Vitamin (MULTIVITAMIN WITH MINERALS) TABS tablet Take 1 tablet by mouth daily.   Yes [provider]     Allergies:  No Known Allergies   Physical Exam:   Vitals  Blood pressure (!) 161/80, pulse 70, temperature 98.3 F (36.8 C), temperature source Oral, resp. rate (!) 22, height 5' 5.5" (1.664 m), weight 58.1 kg (128 lb), SpO2 100 %.   1. General lying in bed in NAD,    2. Normal affect and insight, Not Suicidal or Homicidal, Awake Alert, Oriented X 3.  3. No F.N deficits, ALL C.Nerves Intact, Strength 5/5 all 4 extremities, Sensation intact all 4 extremities, Plantars down going.  4. Ears and Eyes appear Normal, Conjunctivae clear, PERRLA. Moist Oral Mucosa.  5. Supple Neck, No JVD, No cervical lymphadenopathy appriciated, No Carotid Bruits.  6. Symmetrical Chest wall movement, Good air movement bilaterally, CTAB.  7. RRR, No Gallops, Rubs or Murmurs, No Parasternal Heave.  8. Positive Bowel Sounds, Abdomen Soft, No tenderness, No organomegaly appriciated,No rebound -guarding or rigidity.  9.  No Cyanosis, Normal Skin Turgor, No Skin Rash or Bruise  10. Good muscle tone,  joints appear normal , no effusions, Normal ROM.  11. No Palpable Lymph Nodes in Neck or Axillae     Data Review:    CBC Recent Labs  Lab 08/06/17 1436  WBC 7.5  HGB 13.1  HCT 38.0*  PLT 126*  MCV 92.7  MCH 32.0  MCHC 34.5  RDW 12.7    ------------------------------------------------------------------------------------------------------------------  Chemistries  Recent Labs  Lab 08/06/17 1434 08/06/17 1436  NA  --  139  K  --  4.2  CL  --  106  CO2  --  22  GLUCOSE  --  100*  BUN  --  37*  CREATININE  --  2.71*  CALCIUM  --  9.3  MG 1.7  --    ------------------------------------------------------------------------------------------------------------------ estimated creatinine clearance is 21.4 mL/min (A) (by C-G formula based on SCr of 2.71 mg/dL (H)). ------------------------------------------------------------------------------------------------------------------ Recent Labs    08/06/17 1436  TSH 4.030    Coagulation profile No results for input(s): INR, PROTIME in the last 168 hours. ------------------------------------------------------------------------------------------------------------------- No results for input(s): DDIMER in the last 72 hours. -------------------------------------------------------------------------------------------------------------------  Cardiac Enzymes Recent Labs  Lab 08/06/17 1427  TROPONINI <0.03   ------------------------------------------------------------------------------------------------------------------ No results found for: BNP   ---------------------------------------------------------------------------------------------------------------  Urinalysis    Component Value Date/Time   COLORURINE STRAW (A) 08/06/2017 1619   APPEARANCEUR CLEAR 08/06/2017 1619   LABSPEC 1.004 (L) 08/06/2017 1619   PHURINE 6.0 08/06/2017 1619   GLUCOSEU NEGATIVE 08/06/2017 1619   HGBUR SMALL (A) 08/06/2017 1619   BILIRUBINUR NEGATIVE 08/06/2017 1619   KETONESUR NEGATIVE 08/06/2017 1619   PROTEINUR 100 (A) 08/06/2017 1619   NITRITE NEGATIVE 08/06/2017 1619   LEUKOCYTESUR NEGATIVE 08/06/2017 1619     ----------------------------------------------------------------------------------------------------------------   Imaging Results:    Dg Chest 2 View  Result Date: 08/06/2017 CLINICAL DATA:  Yearly checkup. Atrial fibrillation. Irregular heartbeat. EXAM: CHEST - 2 VIEW COMPARISON:  None. FINDINGS: Cardiomegaly. Aortic atherosclerosis. Clear lung fields. No bony abnormality. IMPRESSION: Cardiomegaly.  No active disease. Electronically Signed   By: Staci Righter M.D.   On: 08/06/2017 16:05   nsr at 36, borderline lad, slight st elevation, j point v2, 3,  No other st-t changes c/w ischemia     Assessment & Plan:    Principal Problem:   Atrial flutter ([facility]) Active Problems:   Acute renal failure superimposed on stage 4 chronic kidney disease ([place])   Essential hypertension    Aflutter with RVR Tele Trop I q6h x3 Check cardiac echo Heparin GTT,  Start carvedilol 25mg  po bid  ARF on CRF Check urine sodium, urine creatinine, urine eosinophils Check renal ultrasound Hydrate with ns iv,  Check cmp in am  Hypertension Start carvedilol     DVT Prophylaxis Heparin -  - SCD  AM Labs Ordered, also please review Full Orders  Family Communication: Admission, patients condition and plan of care including tests being ordered have been discussed with the patient who indicate understanding and agree with the plan and Code Status.  Code Status FULL CODE  Likely DC to  home  Condition GUARDED    Consults called: cardiology by ED  Admission status: inpatient  Time spent in minutes : 45   Jani Gravel M.D on 08/07/2017 at 1:34 AM  Between 7am to 7pm - Pager - [phone]  After 7pm go to [email or web address] - password [facility]  [facility] - Office  [phone]

## 2017-08-07 NOTE — Progress Notes (Signed)
Patient is stable for discharge. Discharge instructions and medications have been reviewed with the patient and his family at the bedside and all questions answered. AVS and prescriptions given to patient.  Othella Boyer [facility] 08/07/2017

## 2017-08-07 NOTE — ED Notes (Signed)
ED TO INPATIENT HANDOFF REPORT  Name/Age/Gender David Leblanc 67 y.o. male  Code Status   Home/SNF/Other Home  Chief Complaint A-FIB  Level of Care/Admitting Diagnosis ED Disposition    ED Disposition Condition Comment   Admit  Hospital Area: [place] [[id number]]  Level of Care: Telemetry [5]  Admit to tele based on following criteria: Monitor for Ischemic changes  Diagnosis: Atrial flutter with rapid ventricular response [facility]) [[id number]]  Admitting Physician: Jani Gravel [[id number]]  Attending Physician: Jani Gravel [phone]  Estimated length of stay: past midnight tomorrow  Certification:: I certify this patient will need inpatient services for at least 2 midnights  PT Class (Do Not Modify): Inpatient [101]  PT Acc Code (Do Not Modify): Private [1]       Medical History Past Medical History:  Diagnosis Date  . Hypertension   . Tobacco use     Allergies No Known Allergies  IV Location/Drains/Wounds Patient Lines/Drains/Airways Status   Active Line/Drains/Airways    Name:   Placement date:   Placement time:   Site:   Days:   Peripheral IV 08/06/17 Right Forearm   08/06/17    1528    Forearm   1   Peripheral IV 08/06/17 Right Hand   08/06/17    1815    Hand   1          Labs/Imaging Results for orders placed or performed during the hospital encounter of 08/06/17 (from the past 48 hour(s))  Troponin I     Status: None   Collection Time: 08/06/17  2:27 PM  Result Value Ref Range   Troponin I <0.03 <0.03 ng/mL    Comment: Performed at [facility], [place] [address], [place], [place] [address]  Magnesium     Status: None   Collection Time: 08/06/17  2:34 PM  Result Value Ref Range   Magnesium 1.7 1.7 - 2.4 mg/dL    Comment: Performed at [facility], [place] [address], [place], [place] [address]  Basic metabolic panel     Status: Abnormal   Collection Time: 08/06/17  2:36 PM  Result Value Ref Range   Sodium 139  135 - 145 mmol/L   Potassium 4.2 3.5 - 5.1 mmol/L   Chloride 106 101 - 111 mmol/L   CO2 22 22 - 32 mmol/L   Glucose, Bld 100 (H) 65 - 99 mg/dL   BUN 37 (H) 6 - 20 mg/dL   Creatinine, Ser 2.71 (H) 0.61 - 1.24 mg/dL   Calcium 9.3 8.9 - 10.3 mg/dL   GFR calc non Af Amer 23 (L) >60 mL/min   GFR calc Af Amer 26 (L) >60 mL/min    Comment: (NOTE) The eGFR has been calculated using the CKD EPI equation. This calculation has not been validated in all clinical situations. eGFR's persistently <60 mL/min signify possible Chronic Kidney Disease.    Anion gap 11 5 - 15    Comment: Performed at [facility], [place] [address] [place], [place] [address]  CBC     Status: Abnormal   Collection Time: 08/06/17  2:36 PM  Result Value Ref Range   WBC 7.5 4.0 - 10.5 K/uL   RBC 4.10 (L) 4.22 - 5.81 MIL/uL   Hemoglobin 13.1 13.0 - 17.0 g/dL   HCT 38.0 (L) 39.0 - 52.0 %   MCV 92.7 78.0 - 100.0 fL   MCH 32.0 26.0 - 34.0 pg   MCHC 34.5 30.0 - 36.0 g/dL  RDW 12.7 11.5 - 15.5 %   Platelets 126 (L) 150 - 400 K/uL    Comment: Performed at [facility], [place] [address], [place], [place] [address]  TSH     Status: None   Collection Time: 08/06/17  2:36 PM  Result Value Ref Range   TSH 4.030 0.350 - 4.500 uIU/mL    Comment: Performed by a 3rd Generation assay with a functional sensitivity of <=0.01 uIU/mL. Performed at [facility] [facility], [place] [address], [place], [place] [address]   T4, free     Status: None   Collection Time: 08/06/17  2:36 PM  Result Value Ref Range   Free T4 1.02 0.61 - 1.12 ng/dL    Comment: (NOTE) Biotin ingestion may interfere with free T4 tests. If the results are inconsistent with the TSH level, previous test results, or the clinical presentation, then consider biotin interference. If needed, order repeat testing after stopping biotin. Performed at [facility], [place] [address] [place], [place] [address]   Urinalysis,  Routine w reflex microscopic     Status: Abnormal   Collection Time: 08/06/17  4:19 PM  Result Value Ref Range   Color, Urine STRAW (A) YELLOW   APPearance CLEAR CLEAR   Specific Gravity, Urine 1.004 (L) 1.005 - 1.030   pH 6.0 5.0 - 8.0   Glucose, UA NEGATIVE NEGATIVE mg/dL   Hgb urine dipstick SMALL (A) NEGATIVE   Bilirubin Urine NEGATIVE NEGATIVE   Ketones, ur NEGATIVE NEGATIVE mg/dL   Protein, ur 100 (A) NEGATIVE mg/dL   Nitrite NEGATIVE NEGATIVE   Leukocytes, UA NEGATIVE NEGATIVE   RBC / HPF 0-5 0 - 5 RBC/hpf   WBC, UA 0-5 0 - 5 WBC/hpf   Bacteria, UA NONE SEEN NONE SEEN   Squamous Epithelial / LPF 0-5 (A) NONE SEEN   Hyaline Casts, UA PRESENT     Comment: Performed at [facility], [place] [address], [place], [place] [address]   Dg Chest 2 View  Result Date: 08/06/2017 CLINICAL DATA:  Yearly checkup. Atrial fibrillation. Irregular heartbeat. EXAM: CHEST - 2 VIEW COMPARISON:  None. FINDINGS: Cardiomegaly. Aortic atherosclerosis. Clear lung fields. No bony abnormality. IMPRESSION: Cardiomegaly.  No active disease. Electronically Signed   By: Staci Righter M.D.   On: 08/06/2017 16:05    Pending Labs Unresulted Labs (From admission, onward)   Start     Ordered   08/08/17 0500  Heparin level (unfractionated)  Daily,   R     08/06/17 1719   08/07/17 0500  CBC  Daily,   R     08/06/17 1717   08/07/17 0200  Heparin level (unfractionated)  Once-Timed,   R     08/06/17 1719   08/07/17 0200  Protime-INR  Tomorrow morning,   R     08/06/17 1721   Signed and Held  Comprehensive metabolic panel  Tomorrow morning,   R     Signed and Held   Signed and Held  CBC  Tomorrow morning,   R     Signed and Held   Signed and Held  Troponin I  Now then every 6 hours,   R     Signed and Held      Vitals/Pain Today's Vitals   08/07/17 0000 08/07/17 0030 08/07/17 0100 08/07/17 0130  BP: (!) 161/80 (!) 160/86 (!) 164/89 (!) 162/86  Pulse: 70 66 72 70  Resp: (!) '22 18 14 13   ' Temp:  TempSrc:      SpO2: 100% 99% 100% 100%  Weight:      Height:      PainSc:        Isolation Precautions No active isolations  Medications Medications  carvedilol (COREG) tablet 25 mg (25 mg Oral Given 08/06/17 1807)  heparin ADULT infusion 100 units/mL (25000 units/267m sodium chloride 0.45%) (850 Units/hr Intravenous Canceled Entry 08/06/17 1821)  metoprolol tartrate (LOPRESSOR) injection 5 mg (5 mg Intravenous Given 08/06/17 1533)  sodium chloride 0.9 % bolus 1,000 mL (0 mLs Intravenous Stopped 08/06/17 1602)  heparin bolus via infusion 3,000 Units (3,000 Units Intravenous Bolus from Bag 08/06/17 1816)    Mobility walks

## 2017-08-07 NOTE — Progress Notes (Signed)
ANTICOAGULATION CONSULT NOTE - f/u Consult  Pharmacy Consult for Heparin Indication: atrial fibrillation  No Known Allergies  Patient Measurements: Height: 5' 5.5" (166.4 cm) Weight: 128 lb 1.6 oz (58.1 kg) IBW/kg (Calculated) : 62.65 Heparin Dosing Weight: 58 kg  Vital Signs: Temp: 98.4 F (36.9 C) (04/19 0239) Temp Source: Oral (04/19 0239) BP: 168/79 (04/19 0239) Pulse Rate: 70 (04/19 0239)  Labs: Recent Labs    08/06/17 1427 08/06/17 1436 08/07/17 0233 08/07/17 0238  HGB  --  13.1 12.1*  --   HCT  --  38.0* 35.3*  --   PLT  --  126* 122*  --   LABPROT  --   --   --  14.7  INR  --   --   --  1.16  HEPARINUNFRC  --   --   --  0.54  CREATININE  --  2.71*  --   --   TROPONINI <0.03  --   --   --     Estimated Creatinine Clearance: 21.4 mL/min (A) (by C-G formula based on SCr of 2.71 mg/dL (H)).   Medical History: Past Medical History:  Diagnosis Date  . Hypertension   . Tobacco use      Assessment: 59 YOM presents to PCP visit (establishing care as last time saw physician was ~2y ago) and found to be tachycardic w/ abnormal EKG. Sent to [facility] ED and found to have aflutter with RVR. Pharmacy asked to dose heparin gtt.     4/18  CBC: Hgb WNL, platelets mildly decreased (no previous labs seen in [facility])  Renal: SCr elevated  No reportedly on antithrombotics prior to admission  Today, 4/19  0238 HL=0.54 at goal, no infusion or bleeding issues per RN.   Goal of Therapy:  Heparin level 0.3-0.7 units/ml Monitor platelets by anticoagulation protocol: Yes   Plan:   Continue heparin drip at 850 units/hr   Recheck HL in 8 hours  Daily heparin level and CBC  Monitor platelet count  Monitor for bleeding  Await plan for long-term anticoagulation for stroke prevention   DOAC not best option based on current renal fx at time of presentation     Dorrene German 08/07/2017 3:12 AM

## 2017-08-07 NOTE — Discharge Instructions (Signed)

## 2017-08-07 NOTE — Progress Notes (Signed)
  Echocardiogram 2D Echocardiogram has been performed.  David Leblanc M 08/07/2017, 8:53 AM

## 2017-08-08 LAB — CALCIUM / CREATININE RATIO, URINE
CALCIUM UR: 1.9 mg/dL
Calcium/Creat.Ratio: 54 mg/g creat (ref 0–260)
Creatinine, Urine: 35.5 mg/dL

## 2017-08-12 LAB — MULTIPLE MYELOMA PANEL, SERUM
ALBUMIN/GLOB SERPL: 1.1 (ref 0.7–1.7)
ALPHA 1: 0.2 g/dL (ref 0.0–0.4)
ALPHA2 GLOB SERPL ELPH-MCNC: 0.7 g/dL (ref 0.4–1.0)
Albumin SerPl Elph-Mcnc: 3.1 g/dL (ref 2.9–4.4)
B-Globulin SerPl Elph-Mcnc: 1.1 g/dL (ref 0.7–1.3)
Gamma Glob SerPl Elph-Mcnc: 1 g/dL (ref 0.4–1.8)
Globulin, Total: 3.1 g/dL (ref 2.2–3.9)
IGA: 469 mg/dL — AB (ref 61–437)
IGG (IMMUNOGLOBIN G), SERUM: 1064 mg/dL (ref 700–1600)
IGM (IMMUNOGLOBULIN M), SRM: 51 mg/dL (ref 20–172)
TOTAL PROTEIN ELP: 6.2 g/dL (ref 6.0–8.5)

## 2017-08-13 ENCOUNTER — Encounter: Payer: Self-pay | Admitting: Cardiology

## 2017-08-28 ENCOUNTER — Ambulatory Visit: Payer: Self-pay | Admitting: Cardiology

## 2017-08-31 ENCOUNTER — Ambulatory Visit: Payer: Medicare HMO | Admitting: Cardiology

## 2017-08-31 ENCOUNTER — Other Ambulatory Visit: Payer: Self-pay

## 2017-08-31 ENCOUNTER — Encounter: Payer: Self-pay | Admitting: Cardiology

## 2017-08-31 VITALS — BP 138/74 | HR 67 | Ht 65.5 in | Wt 135.0 lb

## 2017-08-31 DIAGNOSIS — Z720 Tobacco use: Secondary | ICD-10-CM | POA: Diagnosis not present

## 2017-08-31 DIAGNOSIS — N184 Chronic kidney disease, stage 4 (severe): Secondary | ICD-10-CM

## 2017-08-31 DIAGNOSIS — I10 Essential (primary) hypertension: Secondary | ICD-10-CM

## 2017-08-31 DIAGNOSIS — I4892 Unspecified atrial flutter: Secondary | ICD-10-CM | POA: Diagnosis not present

## 2017-08-31 MED ORDER — CARVEDILOL 25 MG PO TABS: 25.0000 mg | ORAL_TABLET | Freq: Two times a day (BID) | ORAL | 0 refills | Status: DC

## 2017-08-31 MED ORDER — CARVEDILOL 25 MG PO TABS: 25.0000 mg | ORAL_TABLET | Freq: Two times a day (BID) | ORAL | 3 refills | Status: DC

## 2017-08-31 MED ORDER — AMLODIPINE BESYLATE 5 MG PO TABS: 5.0000 mg | ORAL_TABLET | Freq: Every day | ORAL | 0 refills | Status: DC

## 2017-08-31 MED ORDER — AMLODIPINE BESYLATE 5 MG PO TABS: 5.0000 mg | ORAL_TABLET | Freq: Every day | ORAL | 3 refills | Status: DC

## 2017-08-31 NOTE — Progress Notes (Signed)
Cardiology Office Note:    Date:  08/31/2017   ID:  David Leblanc, DOB 1951/04/15, MRN [id number]  PCP:  Raelyn Number, MD  Cardiologist:  Jenkins Rouge, MD  Referring MD: No ref. provider found   Chief Complaint  Patient presents with  . Hospitalization Follow-up    atrial flutter    History of Present Illness:    David Leblanc is a 67 y.o. male with a past medical history significant for hypertension and current smoking who was admitted to the hospital on 08/06/2017 for atrial fibrillation with rapid ventricular response.  The patient had not seen a primary care provider in over 2 years.  At the urging of his sister he presented for routine physical to a PCP and was found to be tachycardic with an abnormal ECG.  He was referred to the [facility] long emergency department where EKG and telemetry revealed 2:1 atrial flutter.  The patient was asymptomatic.  He converted to normal sinus rhythm after IV Lopressor.  His TSH was high normal with otherwise unremarkable electrolytes.  His creatinine was elevated at 2.71.  Chest x-ray showed cardiomegaly. Echocardiogram showed normal LV systolic function and grade 2 diastolic dysfunction.  His blood pressure was elevated.  He was started on carvedilol with the addition of amlodipine 5 mg daily.  The patient had been on antihypertensives in the past but has been taking no medications in approximately 2 years.  He is to follow-up with nephrology.  David Leblanc is here today with his daughter who has just graduated from [employer]. He is doing well. No feelings of recurrent arrhythmia. No chest discomfort, shortness of breath, lightheadedness, syncope, orthopnea or edema. He had an increase in breathing effort while walking around [place] yesterday, 2-3 miles, but he says he is not used to this level of exertion. Otherwise no exertional symptoms. He also has mild leg burning with exertion.    Past Medical History:  Diagnosis Date  . Hypertension   .  Tobacco use     Past Surgical History:  Procedure Laterality Date  . NO PAST SURGERIES      Current Medications: Current Meds  Medication Sig  . amLODipine (NORVASC) 5 MG tablet Take 1 tablet (5 mg total) by mouth daily.  Marland Kitchen apixaban (ELIQUIS) 2.5 MG TABS tablet Take 1 tablet (2.5 mg total) by mouth 2 (two) times daily.  . carvedilol (COREG) 25 MG tablet Take 1 tablet (25 mg total) by mouth 2 (two) times daily with a meal.  . Multiple Vitamin (MULTIVITAMIN WITH MINERALS) TABS tablet Take 1 tablet by mouth daily.  . [DISCONTINUED] amLODipine (NORVASC) 5 MG tablet Take 1 tablet (5 mg total) by mouth daily.  . [DISCONTINUED] carvedilol (COREG) 25 MG tablet Take 1 tablet (25 mg total) by mouth 2 (two) times daily with a meal.     Allergies:   Patient has no known allergies.   Social History   Socioeconomic History  . Marital status: Married    Spouse name: Not on file  . Number of children: Not on file  . Years of education: Not on file  . Highest education level: Not on file  Occupational History  . Not on file  Social Needs  . Financial resource strain: Not on file  . Food insecurity:    Worry: Not on file    Inability: Not on file  . Transportation needs:    Medical: Not on file    Non-medical: Not on file  Tobacco Use  .  Smoking status: Current Every Day Smoker    Packs/day: 0.25    Years: 40.00    Pack years: 10.00    Types: Cigarettes  . Smokeless tobacco: Never Used  Substance and Sexual Activity  . Alcohol use: Yes    Comment: 2 shots per day  . Drug use: Never  . Sexual activity: Not on file  Lifestyle  . Physical activity:    Days per week: Not on file    Minutes per session: Not on file  . Stress: Not on file  Relationships  . Social connections:    Talks on phone: Not on file    Gets together: Not on file    Attends religious service: Not on file    Active member of club or organization: Not on file    Attends meetings of clubs or organizations:  Not on file    Relationship status: Not on file  Other Topics Concern  . Not on file  Social History Narrative  . Not on file     Family History: The patient's family history includes Hypertension in his father. ROS:   Please see the history of present illness.     All other systems reviewed and are negative.  EKGs/Labs/Other Studies Reviewed:    The following studies were reviewed today:  Echocardiogram 08/07/17 Study Conclusions  - Left ventricle: The cavity size was normal. There was mild   concentric hypertrophy. Systolic function was normal. The   estimated ejection fraction was in the range of 55% to 60%. Wall   motion was normal; there were no regional wall motion   abnormalities. Features are consistent with a pseudonormal left   ventricular filling pattern, with concomitant abnormal relaxation   and increased filling pressure (grade 2 diastolic dysfunction). - Aortic valve: There was trivial regurgitation. - Mitral valve: Calcified annulus.   EKG:  EKG is ordered today.  The ekg ordered today demonstrates NSR, LVH, LAD, 68 bpm, QTC 438  Recent Labs: 08/06/2017: Magnesium 1.7; TSH 4.030 08/07/2017: ALT 25; BUN 33; Creatinine, Ser 2.51; Hemoglobin 12.1; Platelets 122; Potassium 4.5; Sodium 139   Recent Lipid Panel No results found for: CHOL, TRIG, HDL, CHOLHDL, VLDL, LDLCALC, LDLDIRECT  Physical Exam:    VS:  BP 138/74   Pulse 67   Ht 5' 5.5" (1.664 m)   Wt 135 lb (61.2 kg)   SpO2 99%   BMI 22.12 kg/m     Wt Readings from Last 3 Encounters:  08/31/17 135 lb (61.2 kg)  08/07/17 128 lb 1.6 oz (58.1 kg)     Physical Exam  Constitutional: He is oriented to person, place, and time. He appears well-developed and well-nourished. No distress.  HENT:  Head: Normocephalic and atraumatic.  Neck: Normal range of motion. Neck supple. No JVD present.  Cardiovascular: Normal rate, regular rhythm, normal heart sounds and intact distal pulses. Exam reveals no gallop  and no friction rub.  No murmur heard. Pulmonary/Chest: Effort normal and breath sounds normal. No respiratory distress. He has no wheezes. He has no rales.  Abdominal: Soft. Bowel sounds are normal. He exhibits no distension.  Musculoskeletal: Normal range of motion. He exhibits no edema or deformity.  Neurological: He is alert and oriented to person, place, and time.  Skin: Skin is warm and dry.  Psychiatric: He has a normal mood and affect. His behavior is normal. Thought content normal.  Vitals reviewed.    ASSESSMENT:    1. Atrial flutter, unspecified type (Central)  2. Essential hypertension   3. CKD (chronic kidney disease) stage 4, GFR 15-29 ml/min (HCC)    PLAN:    In order of problems listed above:  Paroxysmal atrial flutter: Patient presented with asymptomatic atrial flutter with RVR on 08/06/2017.  Converted to sinus rhythm after IV Lopressor.  Was started on carvedilol 25 mg twice daily. Maintaining sinus rhythm per EKG and pt has had no symptoms of recurrence. He is tolerating the medications well.  CHA2DS2-VASc Score (is at least 2) and unadjusted Ischemic Stroke Rate (% per year) is equal to  2.2 % stroke rate/year from a score of 2 (HTN, age).  He has been started on Eliquis 2.5 mg for stroke risk reduction (reduced dose for wt<60 kg and SCr >=1.5)  Hypertension: Started on Coreg 25 mg twice daily and amlodipine 5 mg daily. BP is well controlled. Pt also reports good control at home. Pt advised on DASH diet and daily exercise, starting with 10 minutes of walking and working up to 30 min on most days. Also advised to look into Silver Sneakers program.   CKD: Creatinine was 2.71 on admission to the hospital on 08/06/2017.  It was noted to have been 1.9 two years ago.  He has follow-up with nephrology tomorrow at [facility].  Tobacco use: Was smoking 5-6 cigarettes per day. Is working on quitting and has only had a couple of cigarettes since hospitalization.  Strongly encouraged on continued cessation to reduce his risk of CVD.     Medication Adjustments/Labs and Tests Ordered: Current medicines are reviewed at length with the patient today.  Concerns regarding medicines are outlined above. Labs and tests ordered and medication changes are outlined in the patient instructions below:  Patient Instructions  Medication Instructions: Your physician recommends that you continue on your current medications as directed. Please refer to the Current Medication list given to you today.   Labwork: None Ordered  Procedures/Testing: None Ordrered  Follow-Up: Your physician recommends that you schedule a follow-up appointment in 4 months with Dr.Nishan or APP   Any Additional Special Instructions Will Be Listed Below (If Applicable).     If you need a refill on your cardiac medications before your next appointment, please call your pharmacy.      Signed, Daune Perch, NP  08/31/2017 8:35 AM    [facility]

## 2017-08-31 NOTE — Patient Instructions (Addendum)
Medication Instructions: Your physician recommends that you continue on your current medications as directed. Please refer to the Current Medication list given to you today.   Labwork: None Ordered  Procedures/Testing: None Ordrered  Follow-Up: Your physician recommends that you schedule a follow-up appointment in 4 months with Dr.Nishan or APP   Any Additional Special Instructions Will Be Listed Below (If Applicable).  Exercise for at least 150 minutes a week     DASH Eating Plan DASH stands for "Dietary Approaches to Stop Hypertension." The DASH eating plan is a healthy eating plan that has been shown to reduce high blood pressure (hypertension). It may also reduce your risk for type 2 diabetes, heart disease, and stroke. The DASH eating plan may also help with weight loss. What are tips for following this plan? General guidelines  Avoid eating more than 2,300 mg (milligrams) of salt (sodium) a day. If you have hypertension, you may need to reduce your sodium intake to 1,500 mg a day.  Limit alcohol intake to no more than 1 drink a day for nonpregnant women and 2 drinks a day for men. One drink equals 12 oz of beer, 5 oz of wine, or 1 oz of hard liquor.  Work with your health care provider to maintain a healthy body weight or to lose weight. Ask what an ideal weight is for you.  Get at least 30 minutes of exercise that causes your heart to beat faster (aerobic exercise) most days of the week. Activities may include walking, swimming, or biking.  Work with your health care provider or diet and nutrition specialist (dietitian) to adjust your eating plan to your individual calorie needs. Reading food labels  Check food labels for the amount of sodium per serving. Choose foods with less than 5 percent of the Daily Value of sodium. Generally, foods with less than 300 mg of sodium per serving fit into this eating plan.  To find whole grains, look for the word "whole" as the first  word in the ingredient list. Shopping  Buy products labeled as "low-sodium" or "no salt added."  Buy fresh foods. Avoid canned foods and premade or frozen meals. Cooking  Avoid adding salt when cooking. Use salt-free seasonings or herbs instead of table salt or sea salt. Check with your health care provider or pharmacist before using salt substitutes.  Do not fry foods. Cook foods using healthy methods such as baking, boiling, grilling, and broiling instead.  Cook with heart-healthy oils, such as olive, canola, soybean, or sunflower oil. Meal planning   Eat a balanced diet that includes: ? 5 or more servings of fruits and vegetables each day. At each meal, try to fill half of your plate with fruits and vegetables. ? Up to 6-8 servings of whole grains each day. ? Less than 6 oz of lean meat, poultry, or fish each day. A 3-oz serving of meat is about the same size as a deck of cards. One egg equals 1 oz. ? 2 servings of low-fat dairy each day. ? A serving of nuts, seeds, or beans 5 times each week. ? Heart-healthy fats. Healthy fats called Omega-3 fatty acids are found in foods such as flaxseeds and coldwater fish, like sardines, salmon, and mackerel.  Limit how much you eat of the following: ? Canned or prepackaged foods. ? Food that is high in trans fat, such as fried foods. ? Food that is high in saturated fat, such as fatty meat. ? Sweets, desserts, sugary drinks, and other  foods with added sugar. ? Full-fat dairy products.  Do not salt foods before eating.  Try to eat at least 2 vegetarian meals each week.  Eat more home-cooked food and less restaurant, buffet, and fast food.  When eating at a restaurant, ask that your food be prepared with less salt or no salt, if possible. What foods are recommended? The items listed may not be a complete list. Talk with your dietitian about what dietary choices are best for you. Grains Whole-grain or whole-wheat bread. Whole-grain or  whole-wheat pasta. Brown rice. [place]row. Bulgur. Whole-grain and low-sodium cereals. Pita bread. Low-fat, low-sodium crackers. Whole-wheat flour tortillas. Vegetables Fresh or frozen vegetables (raw, steamed, roasted, or grilled). Low-sodium or reduced-sodium tomato and vegetable juice. Low-sodium or reduced-sodium tomato sauce and tomato paste. Low-sodium or reduced-sodium canned vegetables. Fruits All fresh, dried, or frozen fruit. Canned fruit in natural juice (without added sugar). Meat and other protein foods Skinless chicken or [place]. Ground chicken or [place]. Pork with fat trimmed off. Fish and seafood. Egg whites. Dried beans, peas, or lentils. Unsalted nuts, nut butters, and seeds. Unsalted canned beans. Lean cuts of beef with fat trimmed off. Low-sodium, lean deli meat. Dairy Low-fat (1%) or fat-free (skim) milk. Fat-free, low-fat, or reduced-fat cheeses. Nonfat, low-sodium ricotta or cottage cheese. Low-fat or nonfat yogurt. Low-fat, low-sodium cheese. Fats and oils Soft margarine without trans fats. Vegetable oil. Low-fat, reduced-fat, or light mayonnaise and salad dressings (reduced-sodium). Canola, safflower, olive, soybean, and sunflower oils. Avocado. Seasoning and other foods Herbs. Spices. Seasoning mixes without salt. Unsalted popcorn and pretzels. Fat-free sweets. What foods are not recommended? The items listed may not be a complete list. Talk with your dietitian about what dietary choices are best for you. Grains Baked goods made with fat, such as croissants, muffins, or some breads. Dry pasta or rice meal packs. Vegetables Creamed or fried vegetables. Vegetables in a cheese sauce. Regular canned vegetables (not low-sodium or reduced-sodium). Regular canned tomato sauce and paste (not low-sodium or reduced-sodium). Regular tomato and vegetable juice (not low-sodium or reduced-sodium). Angie Fava. Olives. Fruits Canned fruit in a light or heavy syrup. Fried fruit. Fruit  in cream or butter sauce. Meat and other protein foods Fatty cuts of meat. Ribs. Fried meat. Berniece Salines. Sausage. Bologna and other processed lunch meats. Salami. Fatback. Hotdogs. Bratwurst. Salted nuts and seeds. Canned beans with added salt. Canned or smoked fish. Whole eggs or egg yolks. Chicken or [place] with skin. Dairy Whole or 2% milk, cream, and half-and-half. Whole or full-fat cream cheese. Whole-fat or sweetened yogurt. Full-fat cheese. Nondairy creamers. Whipped toppings. Processed cheese and cheese spreads. Fats and oils Butter. Stick margarine. Lard. Shortening. Ghee. Bacon fat. Tropical oils, such as coconut, palm kernel, or palm oil. Seasoning and other foods Salted popcorn and pretzels. Onion salt, garlic salt, seasoned salt, table salt, and sea salt. Worcestershire sauce. Tartar sauce. Barbecue sauce. Teriyaki sauce. Soy sauce, including reduced-sodium. Steak sauce. Canned and packaged gravies. Fish sauce. Oyster sauce. Cocktail sauce. Horseradish that you find on the shelf. Ketchup. Mustard. Meat flavorings and tenderizers. Bouillon cubes. Hot sauce and Tabasco sauce. Premade or packaged marinades. Premade or packaged taco seasonings. Relishes. Regular salad dressings. Where to find more information:  National Heart, Lung, and [place]: https://wilson-eaton.com/  American Heart Association: www.heart.org Summary  The DASH eating plan is a healthy eating plan that has been shown to reduce high blood pressure (hypertension). It may also reduce your risk for type 2 diabetes, heart disease, and stroke.  With  the DASH eating plan, you should limit salt (sodium) intake to 2,300 mg a day. If you have hypertension, you may need to reduce your sodium intake to 1,500 mg a day.  When on the DASH eating plan, aim to eat more fresh fruits and vegetables, whole grains, lean proteins, low-fat dairy, and heart-healthy fats.  Work with your health care provider or diet and nutrition specialist  (dietitian) to adjust your eating plan to your individual calorie needs. This information is not intended to replace advice given to you by your health care provider. Make sure you discuss any questions you have with your health care provider. Document Released: 03/27/2011 Document Revised: 03/31/2016 Document Reviewed: 03/31/2016 Elsevier Interactive Patient Education  [employer].    If you need a refill on your cardiac medications before your next appointment, please call your pharmacy.

## 2017-09-01 MED ORDER — APIXABAN 2.5 MG PO TABS: 2.5000 mg | ORAL_TABLET | Freq: Two times a day (BID) | ORAL | 3 refills | Status: DC

## 2017-09-07 ENCOUNTER — Telehealth: Payer: Self-pay | Admitting: *Deleted

## 2017-09-07 MED ORDER — RIVAROXABAN 15 MG PO TABS: 15.0000 mg | ORAL_TABLET | Freq: Every day | ORAL | 1 refills | Status: DC

## 2017-09-07 NOTE — Telephone Encounter (Signed)
Pt weight is up from hospital stay to 61.2Kg on 08/31/17  from 58.1Kg on 08/07/17. He is a 67 yrs old male. SCr on 08/07/17 was 2.51. CrCl is 24 mL/min. Thus, per [name] will switch pt to Xarelto 15mg  QD. Thus, telephone pt spoke with his daughter, Caryl Pina who discussed this dose increase and change of medication with pt. Pt would like to finish Eliquis with dose increase and then switch to Xarelto 15mg  QD with largest meal of the day. Thus instructed daughter to have pt take 2 tabs of 2.5mg  Eliquis BID, call us when he has approx 4 tablets left so we can give instructions on when to start Xarelto 15mg  QD. Daughter understood dose instructions and will call us back to get instructions on Xarelto start and to schedule 1 mth follow up. Xarelto Rx sent to pharmacy.

## 2017-09-16 ENCOUNTER — Telehealth: Payer: Self-pay | Admitting: *Deleted

## 2017-09-16 NOTE — Telephone Encounter (Signed)
Telephoned pt's daughter who I had spoke to recently in regards to switching pt to Xarelto from Eliquis. She states her dad has 1 week of Eliquis left and she picked up Xarelto yesterday. Thus, instructed her next week give the AM dose of Eliquis and in evening with supper the largest meal of the day to start Xarelto 15mg  and discontinue Eliquis. 3 mth follow up appt scheduled.

## 2017-09-25 ENCOUNTER — Telehealth: Payer: Self-pay | Admitting: Cardiology

## 2017-09-25 NOTE — Telephone Encounter (Signed)
New Message   Pt's daughter calling, states that the pt went to [facility] primary to be seen and they took an xray which showed fluid in lungs and was told to contact Cardiologist. Please call

## 2017-09-25 NOTE — Telephone Encounter (Signed)
I spoke with patient daughter David Leblanc (dpr on file). She states on Monday pt went to [facility] walk-in clinic due to left knee and lower leg swelling. Pt had a knee and chest x-ray. She states she received a call from [place] at [facility] states chest x-ray showed mild pleural effusion and was told to follow up with primary MD and cardiologist. She states pt denies sob or weight gain. Pt was sent to ED on 08/06/17 and was noted to be in afib with RVR. He was started on Eliquis. Per telephone note 09/07/17 Pt weight is up from hospital stay to 61.2Kg on 08/31/17  from 58.1Kg on 08/07/17. He is a 67 yrs old male. SCr on 08/07/17 was 2.51. CrCl is 24 mL/min. Thus, per [name] will switch pt to Xarelto 15mg  QD I informed her to follow up with pt's primary MD and I will forward to [name] and his nurse. Informed her that she will receive a call early next with with any additional recommendations. She stated understanding and thankful for the call

## 2017-09-30 ENCOUNTER — Encounter: Payer: Self-pay | Admitting: *Deleted

## 2017-09-30 NOTE — Telephone Encounter (Signed)
Agree with switch to lower dose Xarelto given creatinine clearance.  Some of her lower extremity edema may be because of amlodipine side effect.  She does have an upcoming appointment with kidney specialist.  She may need Lasix.  Since there are no significant symptoms of shortness of breath we will not make any changes at this time.  Continue with fluid restriction 1.5 L a day, no salt.  If weight continues to increase, please call back we may initiate Lasix at that time. Candee Furbish, MD

## 2017-09-30 NOTE — Telephone Encounter (Signed)
-----   Message from Skeet Latch, MD sent at 09/29/2017  9:44 PM EDT ----- Eliquis should be increased to 5mg . ----- Message ----- From: Josue Hector, MD Sent: 08/31/2017   5:13 PM To: Skeet Latch, MD, Marcos Eke, RN  This is Dr Quintella Reichert patient  ----- Message ----- From: Marcos Eke, RN Sent: 08/31/2017   4:51 PM To: Josue Hector, MD  [name],  Pt was started on Eliquis 2.5mg  twice a day on 08/07/17 & at that time pt was 58.18kg, Crea-2.71, and age 67 yrs old; which is the correct dose per dosing criteria. Now pt is requesting a Eliquis refill and her wt is 61.2kg, Crea-2.51, and age-67 yrs old; per dosing criteria her dose should be changed to Eliquis 5mg . Please advise and Thank you.

## 2017-09-30 NOTE — Telephone Encounter (Signed)
This encounter was created in error - please disregard.

## 2017-09-30 NOTE — Telephone Encounter (Signed)
Patient's daughter [facility]) aware of recommendations.

## 2017-11-15 ENCOUNTER — Other Ambulatory Visit: Payer: Self-pay | Admitting: Cardiovascular Disease

## 2017-11-16 NOTE — Telephone Encounter (Signed)
Pt last saw Phylliss Bob 08/31/17, has appt to see Dr Johnsie Cancel 01/13/18 upcoming.  Pt's last labs 08/07/17 Creat 2.51, age 67, weight 61.2kg, CrCl 24.04, based on CrCl pt is on appropriate dosage of Xarelto 15mg  QD.  Will refill rx.

## 2017-11-27 ENCOUNTER — Ambulatory Visit (HOSPITAL_COMMUNITY): Payer: Medicare HMO

## 2017-12-02 ENCOUNTER — Other Ambulatory Visit (HOSPITAL_COMMUNITY): Payer: Self-pay

## 2017-12-03 ENCOUNTER — Ambulatory Visit (HOSPITAL_COMMUNITY)
Admission: RE | Admit: 2017-12-03 | Discharge: 2017-12-03 | Disposition: A | Discharge status: Home / Self Care | Payer: Medicare HMO | Source: Ambulatory Visit | Attending: Nephrology | Admitting: Nephrology

## 2017-12-03 DIAGNOSIS — N183 Chronic kidney disease, stage 3 (moderate): Secondary | ICD-10-CM | POA: Insufficient documentation

## 2017-12-03 DIAGNOSIS — D631 Anemia in chronic kidney disease: Secondary | ICD-10-CM | POA: Insufficient documentation

## 2017-12-03 LAB — POCT HEMOGLOBIN-HEMACUE: HEMOGLOBIN: 9.9 g/dL — AB (ref 13.0–17.0)

## 2017-12-03 MED ORDER — SODIUM CHLORIDE 0.9 % IV SOLN
510.0000 mg | Freq: Once | INTRAVENOUS | Status: AC
  Administered 2017-12-03: 510 mg via INTRAVENOUS
  Filled 2017-12-03: qty 17

## 2017-12-03 MED ORDER — DARBEPOETIN ALFA 100 MCG/0.5ML IJ SOSY
100.0000 ug | PREFILLED_SYRINGE | INTRAMUSCULAR | Status: DC
  Administered 2017-12-03: 100 ug via SUBCUTANEOUS

## 2017-12-03 MED ORDER — DARBEPOETIN ALFA 100 MCG/0.5ML IJ SOSY
PREFILLED_SYRINGE | INTRAMUSCULAR | Status: AC
  Administered 2017-12-03: 100 ug via SUBCUTANEOUS
  Filled 2017-12-03: qty 0.5

## 2017-12-03 NOTE — Discharge Instructions (Signed)
Darbepoetin Alfa injection °What is this medicine? °DARBEPOETIN ALFA (dar be POE e tin AL fa) helps your body make more red blood cells. It is used to treat anemia caused by chronic kidney failure and chemotherapy. °This medicine may be used for other purposes; ask your health care provider or pharmacist if you have questions. °COMMON BRAND NAME(S): Aranesp °What should I tell my health care provider before I take this medicine? °They need to know if you have any of these conditions: °-blood clotting disorders or history of blood clots °-cancer patient not on chemotherapy °-cystic fibrosis °-heart disease, such as angina, heart failure, or a history of a heart attack °-hemoglobin level of 12 g/dL or greater °-high blood pressure °-low levels of folate, iron, or vitamin B12 °-seizures °-an unusual or allergic reaction to darbepoetin, erythropoietin, albumin, hamster proteins, latex, other medicines, foods, dyes, or preservatives °-pregnant or trying to get pregnant °-breast-feeding °How should I use this medicine? °This medicine is for injection into a vein or under the skin. It is usually given by a health care professional in a hospital or clinic setting. °If you get this medicine at home, you will be taught how to prepare and give this medicine. Use exactly as directed. Take your medicine at regular intervals. Do not take your medicine more often than directed. °It is important that you put your used needles and syringes in a special sharps container. Do not put them in a trash can. If you do not have a sharps container, call your pharmacist or healthcare provider to get one. °A special MedGuide will be given to you by the pharmacist with each prescription and refill. Be sure to read this information carefully each time. °Talk to your pediatrician regarding the use of this medicine in children. While this medicine may be used in children as young as 1 year for selected conditions, precautions do  apply. °Overdosage: If you think you have taken too much of this medicine contact a poison control center or emergency room at once. °NOTE: This medicine is only for you. Do not share this medicine with others. °What if I miss a dose? °If you miss a dose, take it as soon as you can. If it is almost time for your next dose, take only that dose. Do not take double or extra doses. °What may interact with this medicine? °Do not take this medicine with any of the following medications: °-epoetin alfa °This list may not describe all possible interactions. Give your health care provider a list of all the medicines, herbs, non-prescription drugs, or dietary supplements you use. Also tell them if you smoke, drink alcohol, or use illegal drugs. Some items may interact with your medicine. °What should I watch for while using this medicine? °Your condition will be monitored carefully while you are receiving this medicine. °You may need blood work done while you are taking this medicine. °What side effects may I notice from receiving this medicine? °Side effects that you should report to your doctor or health care professional as soon as possible: °-allergic reactions like skin rash, itching or hives, swelling of the face, lips, or tongue °-breathing problems °-changes in vision °-chest pain °-confusion, trouble speaking or understanding °-feeling faint or lightheaded, falls °-high blood pressure °-muscle aches or pains °-pain, swelling, warmth in the leg °-rapid weight gain °-severe headaches °-sudden numbness or weakness of the face, arm or leg °-trouble walking, dizziness, loss of balance or coordination °-seizures (convulsions) °-swelling of the ankles, feet, hands °-  unusually weak or tired °Side effects that usually do not require medical attention (report to your doctor or health care professional if they continue or are bothersome): °-diarrhea °-fever, chills (flu-like symptoms) °-headaches °-nausea, vomiting °-redness,  stinging, or swelling at site where injected °This list may not describe all possible side effects. Call your doctor for medical advice about side effects. You may report side effects to FDA at 1-800-FDA-1088. °Where should I keep my medicine? °Keep out of the reach of children. °Store in a refrigerator between 2 and 8 degrees C (36 and 46 degrees F). Do not freeze. Do not shake. Throw away any unused portion if using a single-dose vial. Throw away any unused medicine after the expiration date. °NOTE: This sheet is a summary. It may not cover all possible information. If you have questions about this medicine, talk to your doctor, pharmacist, or health care provider. °© 2018 Elsevier/Gold Standard (2015-11-26 19:52:26) °Ferumoxytol injection °What is this medicine? °FERUMOXYTOL is an iron complex. Iron is used to make healthy red blood cells, which carry oxygen and nutrients throughout the body. This medicine is used to treat iron deficiency anemia in people with chronic kidney disease. °This medicine may be used for other purposes; ask your health care provider or pharmacist if you have questions. °COMMON BRAND NAME(S): Feraheme °What should I tell my health care provider before I take this medicine? °They need to know if you have any of these conditions: °-anemia not caused by low iron levels °-high levels of iron in the blood °-magnetic resonance imaging (MRI) test scheduled °-an unusual or allergic reaction to iron, other medicines, foods, dyes, or preservatives °-pregnant or trying to get pregnant °-breast-feeding °How should I use this medicine? °This medicine is for injection into a vein. It is given by a health care professional in a hospital or clinic setting. °Talk to your pediatrician regarding the use of this medicine in children. Special care may be needed. °Overdosage: If you think you have taken too much of this medicine contact a poison control center or emergency room at once. °NOTE: This medicine  is only for you. Do not share this medicine with others. °What if I miss a dose? °It is important not to miss your dose. Call your doctor or health care professional if you are unable to keep an appointment. °What may interact with this medicine? °This medicine may interact with the following medications: °-other iron products °This list may not describe all possible interactions. Give your health care provider a list of all the medicines, herbs, non-prescription drugs, or dietary supplements you use. Also tell them if you smoke, drink alcohol, or use illegal drugs. Some items may interact with your medicine. °What should I watch for while using this medicine? °Visit your doctor or healthcare professional regularly. Tell your doctor or healthcare professional if your symptoms do not start to get better or if they get worse. You may need blood work done while you are taking this medicine. °You may need to follow a special diet. Talk to your doctor. Foods that contain iron include: whole grains/cereals, dried fruits, beans, or peas, leafy green vegetables, and organ meats (liver, kidney). °What side effects may I notice from receiving this medicine? °Side effects that you should report to your doctor or health care professional as soon as possible: °-allergic reactions like skin rash, itching or hives, swelling of the face, lips, or tongue °-breathing problems °-changes in blood pressure °-feeling faint or lightheaded, falls °-fever or chills °-flushing,   sweating, or hot feelings °-swelling of the ankles or feet °Side effects that usually do not require medical attention (report to your doctor or health care professional if they continue or are bothersome): °-diarrhea °-headache °-nausea, vomiting °-stomach pain °This list may not describe all possible side effects. Call your doctor for medical advice about side effects. You may report side effects to FDA at 1-800-FDA-1088. °Where should I keep my medicine? °This drug  is given in a hospital or clinic and will not be stored at home. °NOTE: This sheet is a summary. It may not cover all possible information. If you have questions about this medicine, talk to your doctor, pharmacist, or health care provider. °© 2018 Elsevier/Gold Standard (2015-05-10 12:41:49) ° °

## 2018-01-04 NOTE — Progress Notes (Signed)
Cardiology Office Note   Date:  01/13/2018   ID:  David Leblanc, DOB 20-Jan-1951, MRN [id number]  PCP:  David Number, MD  Cardiologist:   David Rouge, MD   No chief complaint on file.     History of Present Illness: David Leblanc is a 67 y.o. male who presents for f/u of HTN, PAF and LE edema. I have never seen patient. Seen by Dr David Leblanc 08/06/17 for rapid atrial fibrillation. Was asymptomatic. Converted spontaneously. Had not seen primary care in 2 years. Cr was elevated at 2.71 TTE with EF 55-60% trivial AR , MAC and normal LA size Current every day smoker CHA2DS2-VASC 2 for age and HTN. Started on Xarelto 15 mg  reduced for wt < 60 kg and SCr >1.5 Some exertional dyspnea and LE edema.   Sees [facility] Some LE edema dependant No palpitations or bleeding issues. He is the brother of David Leblanc' mom  Past Medical History:  Diagnosis Date  . Hypertension   . Tobacco use     Past Surgical History:  Procedure Laterality Date  . NO PAST SURGERIES       Current Outpatient Medications  Medication Sig Dispense Refill  . amLODipine (NORVASC) 5 MG tablet Take 1 tablet (5 mg total) by mouth daily. 90 tablet 3  . carvedilol (COREG) 25 MG tablet Take 1 tablet (25 mg total) by mouth 2 (two) times daily with a meal. 180 tablet 3  . IRON PO Take 65 mg by mouth 2 (two) times daily.    . Multiple Vitamin (MULTIVITAMIN WITH MINERALS) TABS tablet Take 1 tablet by mouth daily.    . sodium bicarbonate 650 MG tablet Take 650 mg by mouth 2 (two) times daily.    Alveda Reasons 15 MG TABS tablet TAKE 1 TABLET BY MOUTH ONCE DAILY WITH SUPPER 30 tablet 5   No current facility-administered medications for this visit.     Allergies:   Patient has no known allergies.    Social History:  The patient  reports that he has been smoking cigarettes. He has a 10.00 pack-year smoking history. He has never used smokeless tobacco. He reports that he drinks alcohol. He reports that he does not use  drugs.   Family History:  The patient's family history includes Hypertension in his father.    ROS:  Please see the history of present illness.   Otherwise, review of systems are positive for none.   All other systems are reviewed and negative.    PHYSICAL EXAM: VS:  BP 134/76   Pulse 61   Ht 5' 4.5" (1.638 m)   Wt 141 lb 3.2 oz (64 kg)   SpO2 99%   BMI 23.86 kg/m  , BMI Body mass index is 23.86 kg/m. Affect appropriate Healthy:  appears stated age 45: normal Neck supple with no adenopathy JVP normal no bruits no thyromegaly Lungs clear with no wheezing and good diaphragmatic motion Heart:  S1/S2 no murmur, no rub, gallop or click PMI normal Abdomen: benighn, BS positve, no tenderness, no AAA no bruit.  No HSM or HJR Distal pulses intact with no bruits No edema Neuro non-focal Skin warm and dry No muscular weakness    EKG:  SR rate 68 LVH 08/31/17    Recent Labs: 08/06/2017: Magnesium 1.7; TSH 4.030 08/07/2017: ALT 25; BUN 33; Creatinine, Ser 2.51; Platelets 122; Potassium 4.5; Sodium 139 12/03/2017: Hemoglobin 9.9    Lipid Panel No results found for: CHOL, TRIG, HDL, CHOLHDL,  VLDL, LDLCALC, LDLDIRECT    Wt Readings from Last 3 Encounters:  01/13/18 141 lb 3.2 oz (64 kg)  12/03/17 133 lb (60.3 kg)  08/31/17 135 lb (61.2 kg)      Other studies Reviewed: Additional studies/ records that were reviewed today include: Notes Dr David Linsey PA TTE and labs.    ASSESSMENT AND PLAN:  1.  PAF:  Continue xarelto and lopressor   2. HTN:  Stop norvasc due to LE edema start cardizem 240 mg f/u renal  3. Diastolic JIR:CVELFY no diuretic given CRF  4. CRF f/u with [facility]  5. Smoking discussed cessation  6. Previous heavy ETOH abstinent now    Current medicines are reviewed at length with the patient today.  The patient does not have concerns regarding medicines.  The following changes have been made:  cardizem 240 mg daily Stop norvasc   Labs/ tests  ordered today include: none  No orders of the defined types were placed in this encounter.    Disposition:   FU with me in a year     Signed, David Rouge, MD  01/13/2018 7:52 AM    [facility] [place], [place], [place]  [address] Phone: [phone]; Fax: [phone]

## 2018-01-13 ENCOUNTER — Ambulatory Visit: Payer: Medicare HMO | Admitting: Cardiovascular Disease

## 2018-01-13 ENCOUNTER — Encounter: Payer: Self-pay | Admitting: Cardiovascular Disease

## 2018-01-13 VITALS — BP 134/76 | HR 61 | Ht 64.5 in | Wt 141.2 lb

## 2018-01-13 DIAGNOSIS — I480 Paroxysmal atrial fibrillation: Secondary | ICD-10-CM

## 2018-01-13 DIAGNOSIS — I5032 Chronic diastolic (congestive) heart failure: Secondary | ICD-10-CM

## 2018-01-13 DIAGNOSIS — I10 Essential (primary) hypertension: Secondary | ICD-10-CM | POA: Diagnosis not present

## 2018-01-13 MED ORDER — DILTIAZEM HCL ER COATED BEADS 240 MG PO TB24: 240.0000 mg | ORAL_TABLET | Freq: Every day | ORAL | 3 refills | Status: DC

## 2018-01-13 NOTE — Patient Instructions (Addendum)
Medication Instructions:  Your physician has recommended you make the following change in your medication:  1-STOP Norvasc 2-START Cardizem 240 mg by mouth daily  Labwork: NONE  Testing/Procedures: NONE  Follow-Up: Your physician wants you to follow-up in: 12 months with [name]. You will receive a reminder letter in the mail two months in advance. If you don't receive a letter, please call our office to schedule the follow-up appointment.   If you need a refill on your cardiac medications before your next appointment, please call your pharmacy.

## 2018-01-13 NOTE — Addendum Note (Signed)
Addended by: Aris Georgia, Edon Hoadley L on: 01/13/2018 09:50 AM   Modules accepted: Orders

## 2018-01-29 ENCOUNTER — Telehealth: Payer: Self-pay | Admitting: Cardiovascular Disease

## 2018-01-29 NOTE — Telephone Encounter (Signed)
Patient's daughter calling about patient having BLE pitting edema from the top of his thighs to his feet. Patient's daughter stated it gets worse as the day goes on. Patient says he just feels uncomfortable, and he has SOB and coughing when laying down. Patient had a recent cold and has been taking Robitussin DM. Patient's daughter thinks it is his new medication. Patient was changed from Norvasc to diltiazem.  Started diltiazem  on 01/15/18. Couple of days ago symptoms started. BP has been fine, but no BP's to report. HR 60's.   Per patient ED note on 08/06/17 " Acute kidney injury on CKD versus CKD: Baseline creatinine unknown.  Likely chronic kidney disease associated with uncontrolled hypertension. Follow-up with nephrology as an outpatient. Kidney function slightly improved this morning with IV fluids. Renal ultrasound showed mildly increased renal parenchymal echogenicity may reflect medical renal disease."  Encourage patient's daughter to call patient's PCP and his nephrologist. Will consult DOD, Dr. Caryl Comes for advisement as well.  Dr. Caryl Comes advised patient to go to [facility]. Informed patient's daughter to go to urgent care that patient would need lab work Artist) to assess which direction to go in. Patient's daughter verbalized understanding and will take patient to urgent care today or tomorrow. Informed her that sooner is better then later and stressed that today patient should go.

## 2018-01-29 NOTE — Telephone Encounter (Signed)
New Message   Pt c/o medication issue:  1. Name of Medication: diltiazem (CARDIZEM LA) 240 MG 24 hr tablet 2. How are you currently taking this medication (dosage and times per day)?   3. Are you having a reaction (difficulty breathing--STAT)?   4. What is your medication issue? Patients daughter is calling because he was recently placed on this medication. She indicates that he has developed some severe swelling in his legs and his skin looks tight. She is not sure if this is a side effect or not.

## 2018-01-31 NOTE — Telephone Encounter (Signed)
Would suggest that he sees PcP and nephrology cardizem given instead of norvasc because of edema. Nephrology may want to change to hydralazine and needs BMET

## 2018-02-01 NOTE — Telephone Encounter (Signed)
Called patient's daughter with Dr. Kyla Balzarine advisement. Patient's daughter stated patient will be seeing PCP today and his nephrologist on Wednesday. Informed patient's daughter that this is a good plan and to make sure patient gets blood work done to check kidney function. Patient's daughter verbalized understanding.

## 2018-02-23 ENCOUNTER — Other Ambulatory Visit (HOSPITAL_COMMUNITY): Payer: Self-pay | Admitting: *Deleted

## 2018-02-23 NOTE — Discharge Instructions (Signed)
Darbepoetin Alfa injection °What is this medicine? °DARBEPOETIN ALFA (dar be POE e tin AL fa) helps your body make more red blood cells. It is used to treat anemia caused by chronic kidney failure and chemotherapy. °This medicine may be used for other purposes; ask your health care provider or pharmacist if you have questions. °COMMON BRAND NAME(S): Aranesp °What should I tell my health care provider before I take this medicine? °They need to know if you have any of these conditions: °-blood clotting disorders or history of blood clots °-cancer patient not on chemotherapy °-cystic fibrosis °-heart disease, such as angina, heart failure, or a history of a heart attack °-hemoglobin level of 12 g/dL or greater °-high blood pressure °-low levels of folate, iron, or vitamin B12 °-seizures °-an unusual or allergic reaction to darbepoetin, erythropoietin, albumin, hamster proteins, latex, other medicines, foods, dyes, or preservatives °-pregnant or trying to get pregnant °-breast-feeding °How should I use this medicine? °This medicine is for injection into a vein or under the skin. It is usually given by a health care professional in a hospital or clinic setting. °If you get this medicine at home, you will be taught how to prepare and give this medicine. Use exactly as directed. Take your medicine at regular intervals. Do not take your medicine more often than directed. °It is important that you put your used needles and syringes in a special sharps container. Do not put them in a trash can. If you do not have a sharps container, call your pharmacist or healthcare provider to get one. °A special MedGuide will be given to you by the pharmacist with each prescription and refill. Be sure to read this information carefully each time. °Talk to your pediatrician regarding the use of this medicine in children. While this medicine may be used in children as young as 1 year for selected conditions, precautions do  apply. °Overdosage: If you think you have taken too much of this medicine contact a poison control center or emergency room at once. °NOTE: This medicine is only for you. Do not share this medicine with others. °What if I miss a dose? °If you miss a dose, take it as soon as you can. If it is almost time for your next dose, take only that dose. Do not take double or extra doses. °What may interact with this medicine? °Do not take this medicine with any of the following medications: °-epoetin alfa °This list may not describe all possible interactions. Give your health care provider a list of all the medicines, herbs, non-prescription drugs, or dietary supplements you use. Also tell them if you smoke, drink alcohol, or use illegal drugs. Some items may interact with your medicine. °What should I watch for while using this medicine? °Your condition will be monitored carefully while you are receiving this medicine. °You may need blood work done while you are taking this medicine. °What side effects may I notice from receiving this medicine? °Side effects that you should report to your doctor or health care professional as soon as possible: °-allergic reactions like skin rash, itching or hives, swelling of the face, lips, or tongue °-breathing problems °-changes in vision °-chest pain °-confusion, trouble speaking or understanding °-feeling faint or lightheaded, falls °-high blood pressure °-muscle aches or pains °-pain, swelling, warmth in the leg °-rapid weight gain °-severe headaches °-sudden numbness or weakness of the face, arm or leg °-trouble walking, dizziness, loss of balance or coordination °-seizures (convulsions) °-swelling of the ankles, feet, hands °-  unusually weak or tired °Side effects that usually do not require medical attention (report to your doctor or health care professional if they continue or are bothersome): °-diarrhea °-fever, chills (flu-like symptoms) °-headaches °-nausea, vomiting °-redness,  stinging, or swelling at site where injected °This list may not describe all possible side effects. Call your doctor for medical advice about side effects. You may report side effects to FDA at 1-800-FDA-1088. °Where should I keep my medicine? °Keep out of the reach of children. °Store in a refrigerator between 2 and 8 degrees C (36 and 46 degrees F). Do not freeze. Do not shake. Throw away any unused portion if using a single-dose vial. Throw away any unused medicine after the expiration date. °NOTE: This sheet is a summary. It may not cover all possible information. If you have questions about this medicine, talk to your doctor, pharmacist, or health care provider. °© 2018 Elsevier/Gold Standard (2015-11-26 19:52:26) °Ferumoxytol injection °What is this medicine? °FERUMOXYTOL is an iron complex. Iron is used to make healthy red blood cells, which carry oxygen and nutrients throughout the body. This medicine is used to treat iron deficiency anemia in people with chronic kidney disease. °This medicine may be used for other purposes; ask your health care provider or pharmacist if you have questions. °COMMON BRAND NAME(S): Feraheme °What should I tell my health care provider before I take this medicine? °They need to know if you have any of these conditions: °-anemia not caused by low iron levels °-high levels of iron in the blood °-magnetic resonance imaging (MRI) test scheduled °-an unusual or allergic reaction to iron, other medicines, foods, dyes, or preservatives °-pregnant or trying to get pregnant °-breast-feeding °How should I use this medicine? °This medicine is for injection into a vein. It is given by a health care professional in a hospital or clinic setting. °Talk to your pediatrician regarding the use of this medicine in children. Special care may be needed. °Overdosage: If you think you have taken too much of this medicine contact a poison control center or emergency room at once. °NOTE: This medicine  is only for you. Do not share this medicine with others. °What if I miss a dose? °It is important not to miss your dose. Call your doctor or health care professional if you are unable to keep an appointment. °What may interact with this medicine? °This medicine may interact with the following medications: °-other iron products °This list may not describe all possible interactions. Give your health care provider a list of all the medicines, herbs, non-prescription drugs, or dietary supplements you use. Also tell them if you smoke, drink alcohol, or use illegal drugs. Some items may interact with your medicine. °What should I watch for while using this medicine? °Visit your doctor or healthcare professional regularly. Tell your doctor or healthcare professional if your symptoms do not start to get better or if they get worse. You may need blood work done while you are taking this medicine. °You may need to follow a special diet. Talk to your doctor. Foods that contain iron include: whole grains/cereals, dried fruits, beans, or peas, leafy green vegetables, and organ meats (liver, kidney). °What side effects may I notice from receiving this medicine? °Side effects that you should report to your doctor or health care professional as soon as possible: °-allergic reactions like skin rash, itching or hives, swelling of the face, lips, or tongue °-breathing problems °-changes in blood pressure °-feeling faint or lightheaded, falls °-fever or chills °-flushing,   sweating, or hot feelings °-swelling of the ankles or feet °Side effects that usually do not require medical attention (report to your doctor or health care professional if they continue or are bothersome): °-diarrhea °-headache °-nausea, vomiting °-stomach pain °This list may not describe all possible side effects. Call your doctor for medical advice about side effects. You may report side effects to FDA at 1-800-FDA-1088. °Where should I keep my medicine? °This drug  is given in a hospital or clinic and will not be stored at home. °NOTE: This sheet is a summary. It may not cover all possible information. If you have questions about this medicine, talk to your doctor, pharmacist, or health care provider. °© 2018 Elsevier/Gold Standard (2015-05-10 12:41:49) ° °

## 2018-02-24 ENCOUNTER — Ambulatory Visit (HOSPITAL_COMMUNITY)
Admission: RE | Admit: 2018-02-24 | Discharge: 2018-02-24 | Disposition: A | Discharge status: Home / Self Care | Payer: Medicare HMO | Source: Ambulatory Visit | Attending: Nephrology | Admitting: Nephrology

## 2018-02-24 VITALS — BP 151/72 | HR 59 | Temp 98.6°F | Resp 20

## 2018-02-24 DIAGNOSIS — D631 Anemia in chronic kidney disease: Secondary | ICD-10-CM | POA: Insufficient documentation

## 2018-02-24 DIAGNOSIS — N184 Chronic kidney disease, stage 4 (severe): Secondary | ICD-10-CM | POA: Diagnosis present

## 2018-02-24 LAB — POCT HEMOGLOBIN-HEMACUE: HEMOGLOBIN: 11.5 g/dL — AB (ref 13.0–17.0)

## 2018-02-24 MED ORDER — SODIUM CHLORIDE 0.9 % IV SOLN
510.0000 mg | INTRAVENOUS | Status: DC
  Administered 2018-02-24: 510 mg via INTRAVENOUS
  Filled 2018-02-24: qty 17

## 2018-02-24 MED ORDER — DARBEPOETIN ALFA 100 MCG/0.5ML IJ SOSY
100.0000 ug | PREFILLED_SYRINGE | INTRAMUSCULAR | Status: DC
  Administered 2018-02-24: 100 ug via SUBCUTANEOUS

## 2018-02-24 MED ORDER — DARBEPOETIN ALFA 100 MCG/0.5ML IJ SOSY
PREFILLED_SYRINGE | INTRAMUSCULAR | Status: AC
  Administered 2018-02-24: 100 ug via SUBCUTANEOUS
  Filled 2018-02-24: qty 0.5

## 2018-03-24 ENCOUNTER — Encounter (HOSPITAL_COMMUNITY): Payer: Medicare HMO

## 2018-05-10 ENCOUNTER — Other Ambulatory Visit: Payer: Self-pay | Admitting: Cardiovascular Disease

## 2018-08-07 ENCOUNTER — Other Ambulatory Visit: Payer: Self-pay | Admitting: Cardiology

## 2018-11-29 ENCOUNTER — Other Ambulatory Visit: Payer: Self-pay | Admitting: Cardiovascular Disease

## 2018-11-29 NOTE — Telephone Encounter (Signed)
Pt last saw Dr Johnsie Cancel 01/13/18, last labs 10/07/18 Creat 3.61 per [facility] at [place], age 68, weight 64kg, CrCl 17.24, based on CrCl pt is on appropriate dosage of Xarelto 15mg  QD.  Will refill rx.

## 2019-01-20 NOTE — Progress Notes (Signed)
Cardiology Office Note   Date:  01/21/2019   ID:  David Leblanc, DOB 1951-03-29, MRN [id number]  PCP:  Bonnita Nasuti, MD  Cardiologist:   Jenkins Rouge, MD   No chief complaint on file.     History of Present Illness: David Leblanc is a 68 y.o. male who presents for f/u of HTN, PAF and LE edema. . Seen by Dr Oval Linsey 08/06/17 for rapid atrial fibrillation. Was asymptomatic. Converted spontaneously. Had not seen primary care in 2 years. Cr was elevated at 2.71 TTE with EF 55-60% trivial AR , MAC and normal LA size Current every day smoker CHA2DS2-VASC 2 for age and HTN. Started on Xarelto 15 mg  reduced for wt < 60 kg and SCr >1.5 Some exertional dyspnea and LE edema.   Sees [place] [facility] Some LE edema dependant No palpitations or bleeding issues. He is the brother of [name] Reviewed last note from Dr Lawson Radar Cr running 3.3 to 3.6 range He is not interested in PD or transplant evaluation Has not been referred to [facility] for access. No uremic symptoms Taking iron and bicarb and lasix   No cardiac symptoms   Past Medical History:  Diagnosis Date  . Hypertension   . Tobacco use     Past Surgical History:  Procedure Laterality Date  . NO PAST SURGERIES       Current Outpatient Medications  Medication Sig Dispense Refill  . carvedilol (COREG) 25 MG tablet TAKE 1 TABLET BY MOUTH TWICE DAILY WITH A MEAL 180 tablet 1  . diltiazem (CARDIZEM LA) 240 MG 24 hr tablet Take 1 tablet (240 mg total) by mouth daily. 90 tablet 3  . furosemide (LASIX) 40 MG tablet Take 40 mg by mouth 2 (two) times daily.    . IRON PO Take 65 mg by mouth 2 (two) times daily.    . Multiple Vitamin (MULTIVITAMIN WITH MINERALS) TABS tablet Take 1 tablet by mouth daily.    . sodium bicarbonate 650 MG tablet Take 650 mg by mouth 2 (two) times daily.    Alveda Reasons 15 MG TABS tablet TAKE 1 TABLET BY MOUTH ONCE DAILY WITH SUPPER 90 tablet 1   No current facility-administered medications for this visit.      Allergies:   Patient has no known allergies.    Social History:  The patient  reports that he has been smoking cigarettes. He has a 10.00 pack-year smoking history. He has never used smokeless tobacco. He reports current alcohol use. He reports that he does not use drugs.   Family History:  The patient's family history includes Hypertension in his father.    ROS:  Please see the history of present illness.   Otherwise, review of systems are positive for none.   All other systems are reviewed and negative.    PHYSICAL EXAM: VS:  BP 140/65   Pulse 60   Ht 5' 4.5" (1.638 m)   Wt 148 lb 14.4 oz (67.5 kg)   SpO2 99%   BMI 25.16 kg/m  , BMI Body mass index is 25.16 kg/m. Affect appropriate Healthy:  appears stated age 26: normal Neck supple with no adenopathy JVP normal no bruits no thyromegaly Lungs clear with no wheezing and good diaphragmatic motion Heart:  S1/S2 no murmur, no rub, gallop or click PMI normal Abdomen: benighn, BS positve, no tenderness, no AAA no bruit.  No HSM or HJR Distal pulses intact with no bruits Plus one bilateral edema Neuro  non-focal Skin warm and dry No muscular weakness    EKG:  SR rate 68 LVH 08/31/17  01/21/19 SR rate 60 LAE ICLBBB LVH   Recent Labs: 02/24/2018: Hemoglobin 11.5    Lipid Panel No results found for: CHOL, TRIG, HDL, CHOLHDL, VLDL, LDLCALC, LDLDIRECT    Wt Readings from Last 3 Encounters:  01/21/19 148 lb 14.4 oz (67.5 kg)  01/13/18 141 lb 3.2 oz (64 kg)  12/03/17 133 lb (60.3 kg)      Other studies Reviewed: Additional studies/ records that were reviewed today include: Notes Dr Oval Linsey PA TTE and labs.    ASSESSMENT AND PLAN:  1.  PAF:  Continue xarelto and lopressor   2. HTN:  Renovascular norvasc d/c due to edema improved  3. Diastolic MBW:GYKZLD on diuretic limited by CRF stable  4. CRF f/u with [place] [facility] Cr 3.3-3.6 no uremic symptoms will need Referral to [facility] for access when he develops  uremic symptoms Continue bicarb and iron Avoid NSAI's and nephrotoxins 5. Smoking discussed cessation  6. Previous heavy ETOH abstinent now    Current medicines are reviewed at length with the patient today.  The patient does not have concerns regarding medicines.  The following changes have been made:  cardizem 240 mg daily Stop norvasc   Labs/ tests ordered today include: none   Orders Placed This Encounter  Procedures  . EKG 12-Lead     Disposition:   FU with me in6 months      Signed, Jenkins Rouge, MD  01/21/2019 9:22 AM    [facility] [place], [place], [place]  [address] Phone: [phone]; Fax: [phone]

## 2019-01-21 ENCOUNTER — Encounter: Payer: Self-pay | Admitting: Cardiovascular Disease

## 2019-01-21 ENCOUNTER — Other Ambulatory Visit: Payer: Self-pay

## 2019-01-21 ENCOUNTER — Ambulatory Visit (INDEPENDENT_AMBULATORY_CARE_PROVIDER_SITE_OTHER): Payer: Medicare Other | Admitting: Cardiovascular Disease

## 2019-01-21 VITALS — BP 140/65 | HR 60 | Ht 64.5 in | Wt 148.9 lb

## 2019-01-21 DIAGNOSIS — I480 Paroxysmal atrial fibrillation: Secondary | ICD-10-CM | POA: Diagnosis not present

## 2019-01-21 DIAGNOSIS — I10 Essential (primary) hypertension: Secondary | ICD-10-CM

## 2019-01-21 MED ORDER — CARVEDILOL 25 MG PO TABS: ORAL_TABLET | ORAL | 3 refills | Status: DC

## 2019-01-21 MED ORDER — DILTIAZEM HCL ER COATED BEADS 240 MG PO TB24: 240.0000 mg | ORAL_TABLET | Freq: Every day | ORAL | 3 refills | Status: DC

## 2019-01-21 MED ORDER — FUROSEMIDE 40 MG PO TABS: 40.0000 mg | ORAL_TABLET | Freq: Two times a day (BID) | ORAL | 3 refills | Status: DC

## 2019-01-21 NOTE — Addendum Note (Signed)
Addended by: Aris Georgia, Amylynn Fano L on: 01/21/2019 09:28 AM   Modules accepted: Orders

## 2019-01-21 NOTE — Patient Instructions (Signed)

## 2019-05-19 ENCOUNTER — Other Ambulatory Visit: Payer: Self-pay | Admitting: Cardiovascular Disease

## 2019-05-20 NOTE — Telephone Encounter (Addendum)
Prescription refill request for Xarelto received.   Last office visit: 01/21/2019, Nishan Weight: 67.5kg Age: 69 y.o. Scr: 3.87, 02/01/2019, via [facility] CrCl: 17 ml/min   Prescription refill sent.

## 2019-07-11 NOTE — Progress Notes (Signed)
Cardiology Office Note   Date:  07/15/2019   ID:  David Leblanc, DOB Aug 25, 1950, MRN [id number]  PCP:  Bonnita Nasuti, MD  Cardiologist:   Jenkins Rouge, MD   No chief complaint on file.     History of Present Illness: David Leblanc is a 69 y.o. male who presents for f/u of HTN, PAF and LE edema. . Seen by Dr Oval Linsey 08/06/17 for rapid atrial fibrillation. Was asymptomatic. Converted spontaneously. Had not seen primary care in 2 years. Cr was elevated at 2.71 TTE with EF 55-60% trivial AR , MAC and normal LA size Current every day smoker CHA2DS2-VASC 2 for age and HTN. Started on Xarelto 15 mg  reduced for wt < 60 kg and SCr >1.5 Some exertional dyspnea and LE edema.   Sees [place] [facility] Some LE edema dependant No palpitations or bleeding issues. He is the brother of [name] Reviewed last note from Dr Lawson Radar Cr running 3.3 to 3.6 range He is not interested in PD or transplant evaluation Has not been referred to [facility] for access. No uremic symptoms Taking iron and bicarb and lasix   No cardiac symptoms Norvasc changed to cardizem 01/21/19 due to edema  Aaron Mose from [place] [facility]  Wife and 3 kids at home with him   Past Medical History:  Diagnosis Date  . Hypertension   . Tobacco use     Past Surgical History:  Procedure Laterality Date  . NO PAST SURGERIES       Current Outpatient Medications  Medication Sig Dispense Refill  . carvedilol (COREG) 25 MG tablet TAKE 1 TABLET BY MOUTH TWICE DAILY WITH A MEAL 180 tablet 3  . diltiazem (CARDIZEM LA) 240 MG 24 hr tablet Take 1 tablet (240 mg total) by mouth daily. 90 tablet 3  . furosemide (LASIX) 40 MG tablet Take 1 tablet (40 mg total) by mouth 2 (two) times daily. 180 tablet 3  . IRON PO Take 65 mg by mouth 2 (two) times daily.    . Multiple Vitamin (MULTIVITAMIN WITH MINERALS) TABS tablet Take 1 tablet by mouth daily.    . sodium bicarbonate 650 MG tablet Take 650 mg by mouth 2 (two)  times daily.    Alveda Reasons 15 MG TABS tablet TAKE 1 TABLET BY MOUTH ONCE DAILY WITH SUPPER 90 tablet 1   No current facility-administered medications for this visit.    Allergies:   Patient has no known allergies.    Social History:  The patient  reports that he has been smoking cigarettes. He has a 10.00 pack-year smoking history. He has never used smokeless tobacco. He reports current alcohol use. He reports that he does not use drugs.   Family History:  The patient's family history includes Hypertension in his father.    ROS:  Please see the history of present illness.   Otherwise, review of systems are positive for none.   All other systems are reviewed and negative.    PHYSICAL EXAM: VS:  BP 122/62   Pulse (!) 49   Ht 5' 5.5" (1.664 m)   Wt 150 lb (68 kg)   SpO2 98%   BMI 24.58 kg/m  , BMI Body mass index is 24.58 kg/m. Affect appropriate Healthy:  appears stated age 32: normal Neck supple with no adenopathy JVP normal no bruits no thyromegaly Lungs clear with no wheezing and good diaphragmatic motion Heart:  S1/S2 no murmur, no rub, gallop or click  PMI normal Abdomen: benighn, BS positve, no tenderness, no AAA no bruit.  No HSM or HJR Distal pulses intact with no bruits Plus one bilateral edema Neuro non-focal Skin warm and dry No muscular weakness    EKG:  SR rate 68 LVH 08/31/17  01/21/19 SR rate 60 LAE ICLBBB LVH   Recent Labs: No results found for requested labs within last 8760 hours.    Lipid Panel No results found for: CHOL, TRIG, HDL, CHOLHDL, VLDL, LDLCALC, LDLDIRECT    Wt Readings from Last 3 Encounters:  07/15/19 150 lb (68 kg)  01/21/19 148 lb 14.4 oz (67.5 kg)  01/13/18 141 lb 3.2 oz (64 kg)      Other studies Reviewed: Additional studies/ records that were reviewed today include: Notes Dr Oval Linsey PA TTE and labs.    ASSESSMENT AND PLAN:  1.  PAF:  Continue xarelto and lopressor   2. HTN:  Renovascular norvasc d/c due to edema  improved  3. Diastolic JKQ:ASUORV on diuretic limited by CRF stable  4. CRF f/u with [place] [facility] Cr 3.3-3.6 no uremic symptoms will need Referral to [facility] for access when he develops uremic symptoms Continue bicarb and iron Avoid NSAI's and nephrotoxins 5. Smoking discussed cessation  6. Previous heavy ETOH abstinent now    Current medicines are reviewed at length with the patient today.  The patient does not have concerns regarding medicines.  The following changes have been made:   None   Labs/ tests ordered today include: none   No orders of the defined types were placed in this encounter.    Disposition:   FU with me in a year     Signed, Jenkins Rouge, MD  07/15/2019 9:35 AM    [facility] [place], [place], [place]  [address] Phone: [phone]; Fax: [phone]

## 2019-07-15 ENCOUNTER — Ambulatory Visit: Payer: Medicare Other | Admitting: Cardiovascular Disease

## 2019-07-15 ENCOUNTER — Encounter: Payer: Self-pay | Admitting: Cardiovascular Disease

## 2019-07-15 VITALS — BP 122/62 | HR 49 | Ht 65.5 in | Wt 150.0 lb

## 2019-07-15 DIAGNOSIS — I480 Paroxysmal atrial fibrillation: Secondary | ICD-10-CM

## 2019-07-15 NOTE — Patient Instructions (Addendum)
Medication Instructions:  Your physician recommends that you continue on your current medications as directed. Please refer to the Current Medication list given to you today.  *If you need a refill on your cardiac medications before your next appointment, please call your pharmacy*   Lab Work: None ordered  If you have labs (blood work) drawn today and your tests are completely normal, you will receive your results only by: Marland Kitchen MyChart Message (if you have MyChart) OR . A paper copy in the mail If you have any lab test that is abnormal or we need to change your treatment, we will call you to review the results.   Testing/Procedures: None ordered   Follow-Up: At [facility], you and your health needs are our priority.  As part of our continuing mission to provide you with exceptional heart care, we have created designated Provider Care Teams.  These Care Teams include your primary Cardiologist (physician) and Advanced Practice Providers (APPs -  Physician Assistants and Nurse Practitioners) who all work together to provide you with the care you need, when you need it.  We recommend signing up for the patient portal called "MyChart".  Sign up information is provided on this After Visit Summary.  MyChart is used to connect with patients for Virtual Visits (Telemedicine).  Patients are able to view lab/test results, encounter notes, upcoming appointments, etc.  Non-urgent messages can be sent to your provider as well.   To learn more about what you can do with MyChart, go to [email or web address].    Your next appointment:   12 month(s)  The format for your next appointment:   In Person  Provider:   You may see Jenkins Rouge, MD or one of the following Advanced Practice Providers on your designated Care Team:    Truitt Merle, NP  Cecilie Kicks, NP  Kathyrn Drown, NP    Other Instructions

## 2019-12-07 ENCOUNTER — Other Ambulatory Visit: Payer: Self-pay | Admitting: Cardiovascular Disease

## 2019-12-07 ENCOUNTER — Telehealth: Payer: Self-pay | Admitting: Pharmacist

## 2019-12-07 NOTE — Telephone Encounter (Signed)
Received message from Dr Johnsie Cancel to change pt to David Leblanc. He qualifies for 5mg   BID based on age < 59 and weight > 60kg.  Left message for pt to discuss change in anticoagulation.

## 2019-12-07 NOTE — Telephone Encounter (Addendum)
Age 69, weight 68kg, SCr 3.94 on 08/29/19 at [facility], pt very close to 65mL/min cutoff below which Xarelto has not been studied as much. Will send message to Dr Johnsie Cancel regarding potential change to Eliquis which has better safety data in CKD patients as well as dialysis due to less renal elimination.

## 2019-12-07 NOTE — Telephone Encounter (Signed)
Received refill request for Xarelto. Age 69, weight 68kg, SCr 3.94 on 08/29/19 at [place], CrCl 28mL/min which is very close to 64mL/min cutoff below which Xarelto has not been as studied. Message sent to Dr Johnsie Cancel regarding potential change to Eliquis due to better data in patients with CKD (as well as dialysis if pt progresses to that). Dr Johnsie Cancel advised to change pt to Eliquis. He qualifies for 5mg  BID dosing based on age and weight.  Left message for pt to discuss change in anticoagulation. Of note, he has previously taken Eliquis (this was changed a few years ago when his weight was trending right at 60kg cutoff for Eliquis dosing, but he has gained about 8kg since then so Eliquis dosing is more definitive).

## 2019-12-08 MED ORDER — APIXABAN 5 MG PO TABS: 5.0000 mg | ORAL_TABLET | Freq: Two times a day (BID) | ORAL | 5 refills | Status: DC

## 2019-12-08 NOTE — Telephone Encounter (Signed)
Spoke with pt, he is aware of change back to Eliquis. He will finish using his Xarelto tabs, then start Eliquis 5mg  BID with first dose 24 hours after his last dose of Xarelto.

## 2020-03-05 ENCOUNTER — Telehealth: Payer: Self-pay | Admitting: Cardiovascular Disease

## 2020-03-05 NOTE — Telephone Encounter (Signed)
Returned call to pt. He states Eliquis has increased to $133 per month which is not affordable currently. He has 2 tablets left. Since it is so close to the end of the year, will sample pt through the end of December. He is aware he can pick these up at the front desk. Starting in January when his plan resets, he will refill his Eliquis at his pharmacy like usual.

## 2020-03-05 NOTE — Telephone Encounter (Signed)
Pt c/o medication issue:  1. Name of Medication: apixaban (ELIQUIS) 5 MG TABS tablet  2. How are you currently taking this medication (dosage and times per day)? As directed  3. Are you having a reaction (difficulty breathing--STAT)? no  4. What is your medication issue? Patient states that this medication is too expensive even after his insurance pays their part. He would like to know if there is another medication that may be cheaper for him to take.

## 2020-03-05 NOTE — Telephone Encounter (Signed)
Will send message to Pharm D. Patient was on Xarelto in the Summer and switched to Eliquis.

## 2020-03-27 ENCOUNTER — Other Ambulatory Visit: Payer: Self-pay | Admitting: Cardiovascular Disease

## 2020-04-02 ENCOUNTER — Other Ambulatory Visit: Payer: Self-pay | Admitting: Cardiovascular Disease

## 2020-06-30 NOTE — Progress Notes (Deleted)
Cardiology Office Note   Date:  06/30/2020   ID:  David Leblanc, DOB [birth year], MRN [id number]  PCP:  Bonnita Nasuti, MD  Cardiologist:   Jenkins Rouge, MD   No chief complaint on file.     History of Present Illness: David Leblanc is a 71 y.o. male who presents for f/u of HTN, PAF and LE edema. . Seen by Dr Oval Linsey 08/06/17 for rapid atrial fibrillation. Was asymptomatic. Converted spontaneously. Cr was elevated at 2.71 TTE with EF 55-60% trivial AR , MAC and normal LA size Current every day smoker CHA2DS2-VASC 2 for age and HTN. Currently on eliquis for anticoagulation   Sees [facility] [facility] Some LE edema dependant No palpitations or bleeding issues. He is the brother of [name] Reviewed last note from Dr Lawson Radar Cr running 3.3 to 3.6 range He is not interested in PD or transplant evaluation Has not been referred to [facility] for access. No uremic symptoms Taking iron and bicarb and lasix   No cardiac symptoms Norvasc changed to cardizem 01/21/19 due to edema  Got COVID vaccine  Wife and 3 kids at home with him   ***  Past Medical History:  Diagnosis Date  . Hypertension   . Tobacco use     Past Surgical History:  Procedure Laterality Date  . NO PAST SURGERIES       Current Outpatient Medications  Medication Sig Dispense Refill  . apixaban (ELIQUIS) 5 MG TABS tablet Take 1 tablet (5 mg total) by mouth 2 (two) times daily. 60 tablet 5  . carvedilol (COREG) 25 MG tablet TAKE 1 TABLET BY MOUTH TWICE DAILY WITH A MEAL. Please make yearly appt with [name] for March 2022 for future refills. Thank you 1st attempt 180 tablet 0  . diltiazem (CARDIZEM LA) 240 MG 24 hr tablet Take 1 tablet (240 mg total) by mouth daily. 90 tablet 3  . furosemide (LASIX) 40 MG tablet Take 1 tablet (40 mg total) by mouth 2 (two) times daily. 180 tablet 3  . IRON PO Take 65 mg by mouth 2 (two) times daily.    . Multiple Vitamin (MULTIVITAMIN WITH MINERALS) TABS tablet Take 1 tablet by  mouth daily.    . sodium bicarbonate 650 MG tablet Take 650 mg by mouth 2 (two) times daily.     No current facility-administered medications for this visit.    Allergies:   Patient has no known allergies.    Social History:  The patient  reports that he has been smoking cigarettes. He has a 10.00 pack-year smoking history. He has never used smokeless tobacco. He reports current alcohol use. He reports that he does not use drugs.   Family History:  The patient's family history includes Hypertension in his father.    ROS:  Please see the history of present illness.   Otherwise, review of systems are positive for none.   All other systems are reviewed and negative.    PHYSICAL EXAM: VS:  There were no vitals taken for this visit. , BMI There is no height or weight on file to calculate BMI. Affect appropriate Healthy:  appears stated age 88: normal Neck supple with no adenopathy JVP normal no bruits no thyromegaly Lungs clear with no wheezing and good diaphragmatic motion Heart:  S1/S2 no murmur, no rub, gallop or click PMI normal Abdomen: benighn, BS positve, no tenderness, no AAA no bruit.  No HSM or HJR Distal pulses intact with no bruits Plus  one bilateral edema Neuro non-focal Skin warm and dry No muscular weakness    EKG:  SR rate 68 LVH 08/31/17  01/21/19 SR rate 60 LAE ICLBBB LVH   Recent Labs: No results found for requested labs within last 8760 hours.    Lipid Panel No results found for: CHOL, TRIG, HDL, CHOLHDL, VLDL, LDLCALC, LDLDIRECT    Wt Readings from Last 3 Encounters:  07/15/19 68 kg  01/21/19 67.5 kg  01/13/18 64 kg      Other studies Reviewed: Additional studies/ records that were reviewed today include: Notes Dr Oval Linsey PA TTE and labs.    ASSESSMENT AND PLAN:  1.  PAF:  Continue eliquis  and lopressor   2. HTN:  Renovascular norvasc d/c due to edema improved  3. 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 CRF f/u  with [place] [facility] Cr 3.3-3.6 no uremic symptoms will need Referral to [facility] for access when he develops uremic symptoms Continue bicarb and iron Avoid NSAI's and nephrotoxins 5. Smoking discussed cessation  6. Previous heavy ETOH abstinent now    Current medicines are reviewed at length with the patient today.  The patient does not have concerns regarding medicines.  The following changes have been made:   None   Labs/ tests ordered today include: none   No orders of the defined types were placed in this encounter.    Disposition:   FU with me in a year     Signed, Jenkins Rouge, MD  06/30/2020 11:24 PM    [facility] [place], [place], [place]  [address] Phone: [phone]; Fax: [phone]

## 2020-07-12 ENCOUNTER — Ambulatory Visit: Payer: Medicare Other | Admitting: Cardiovascular Disease

## 2020-09-04 ENCOUNTER — Other Ambulatory Visit (HOSPITAL_COMMUNITY): Payer: Self-pay | Admitting: *Deleted

## 2020-09-04 NOTE — Discharge Instructions (Signed)
Epoetin Alfa injection What is this medicine? EPOETIN ALFA (e POE e tin AL fa) helps your body make more red blood cells. This medicine is used to treat anemia caused by chronic kidney disease, cancer chemotherapy, or HIV-therapy. It may also be used before surgery if you have anemia. This medicine may be used for other purposes; ask your health care provider or pharmacist if you have questions. COMMON BRAND NAME(S): Epogen, Procrit, Retacrit What should I tell my health care provider before I take this medicine? They need to know if you have any of these conditions:  cancer  heart disease  high blood pressure  history of blood clots  history of stroke  low levels of folate, iron, or vitamin B12 in the blood  seizures  an unusual or allergic reaction to erythropoietin, albumin, benzyl alcohol, hamster proteins, other medicines, foods, dyes, or preservatives  pregnant or trying to get pregnant  breast-feeding How should I use this medicine? This medicine is for injection into a vein or under the skin. It is usually given by a health care professional in a hospital or clinic setting. If you get this medicine at home, you will be taught how to prepare and give this medicine. Use exactly as directed. Take your medicine at regular intervals. Do not take your medicine more often than directed. It is important that you put your used needles and syringes in a special sharps container. Do not put them in a trash can. If you do not have a sharps container, call your pharmacist or healthcare provider to get one. A special MedGuide will be given to you by the pharmacist with each prescription and refill. Be sure to read this information carefully each time. Talk to your pediatrician regarding the use of this medicine in children. While this drug may be prescribed for selected conditions, precautions do apply. Overdosage: If you think you have taken too much of this medicine contact a poison  control center or emergency room at once. NOTE: This medicine is only for you. Do not share this medicine with others. What if I miss a dose? If you miss a dose, take it as soon as you can. If it is almost time for your next dose, take only that dose. Do not take double or extra doses. What may interact with this medicine? Interactions have not been studied. This list may not describe all possible interactions. Give your health care provider a list of all the medicines, herbs, non-prescription drugs, or dietary supplements you use. Also tell them if you smoke, drink alcohol, or use illegal drugs. Some items may interact with your medicine. What should I watch for while using this medicine? Your condition will be monitored carefully while you are receiving this medicine. You may need blood work done while you are taking this medicine. This medicine may cause a decrease in vitamin B6. You should make sure that you get enough vitamin B6 while you are taking this medicine. Discuss the foods you eat and the vitamins you take with your health care professional. What side effects may I notice from receiving this medicine? Side effects that you should report to your doctor or health care professional as soon as possible:  allergic reactions like skin rash, itching or hives, swelling of the face, lips, or tongue  seizures  signs and symptoms of a blood clot such as breathing problems; changes in vision; chest pain; severe, sudden headache; pain, swelling, warmth in the leg; trouble speaking; sudden numbness or   weakness of the face, arm or leg  signs and symptoms of a stroke like changes in vision; confusion; trouble speaking or understanding; severe headaches; sudden numbness or weakness of the face, arm or leg; trouble walking; dizziness; loss of balance or coordination Side effects that usually do not require medical attention (report to your doctor or health care professional if they continue or are  bothersome):  chills  cough  dizziness  fever  headaches  joint pain  muscle cramps  muscle pain  nausea, vomiting  pain, redness, or irritation at site where injected This list may not describe all possible side effects. Call your doctor for medical advice about side effects. You may report side effects to FDA at 1-800-FDA-1088. Where should I keep my medicine? Keep out of the reach of children. Store in a refrigerator between 2 and 8 degrees C (36 and 46 degrees F). Do not freeze or shake. Throw away any unused portion if using a single-dose vial. Multi-dose vials can be kept in the refrigerator for up to 21 days after the initial dose. Throw away unused medicine. NOTE: This sheet is a summary. It may not cover all possible information. If you have questions about this medicine, talk to your doctor, pharmacist, or health care provider.  2021 Elsevier/Gold Standard (2016-11-14 08:35:19) Ferumoxytol injection What is this medicine? FERUMOXYTOL is an iron complex. Iron is used to make healthy red blood cells, which carry oxygen and nutrients throughout the body. This medicine is used to treat iron deficiency anemia. This medicine may be used for other purposes; ask your health care provider or pharmacist if you have questions. COMMON BRAND NAME(S): Feraheme What should I tell my health care provider before I take this medicine? They need to know if you have any of these conditions:  anemia not caused by low iron levels  high levels of iron in the blood  magnetic resonance imaging (MRI) test scheduled  an unusual or allergic reaction to iron, other medicines, foods, dyes, or preservatives  pregnant or trying to get pregnant  breast-feeding How should I use this medicine? This medicine is for injection into a vein. It is given by a health care professional in a hospital or clinic setting. Talk to your pediatrician regarding the use of this medicine in children. Special  care may be needed. Overdosage: If you think you have taken too much of this medicine contact a poison control center or emergency room at once. NOTE: This medicine is only for you. Do not share this medicine with others. What if I miss a dose? It is important not to miss your dose. Call your doctor or health care professional if you are unable to keep an appointment. What may interact with this medicine? This medicine may interact with the following medications:  other iron products This list may not describe all possible interactions. Give your health care provider a list of all the medicines, herbs, non-prescription drugs, or dietary supplements you use. Also tell them if you smoke, drink alcohol, or use illegal drugs. Some items may interact with your medicine. What should I watch for while using this medicine? Visit your doctor or healthcare professional regularly. Tell your doctor or healthcare professional if your symptoms do not start to get better or if they get worse. You may need blood work done while you are taking this medicine. You may need to follow a special diet. Talk to your doctor. Foods that contain iron include: whole grains/cereals, dried fruits, beans, or   peas, leafy green vegetables, and organ meats (liver, kidney). What side effects may I notice from receiving this medicine? Side effects that you should report to your doctor or health care professional as soon as possible:  allergic reactions like skin rash, itching or hives, swelling of the face, lips, or tongue  breathing problems  changes in blood pressure  feeling faint or lightheaded, falls  fever or chills  flushing, sweating, or hot feelings  swelling of the ankles or feet Side effects that usually do not require medical attention (report to your doctor or health care professional if they continue or are bothersome):  diarrhea  headache  nausea, vomiting  stomach pain This list may not describe  all possible side effects. Call your doctor for medical advice about side effects. You may report side effects to FDA at 1-800-FDA-1088. Where should I keep my medicine? This drug is given in a hospital or clinic and will not be stored at home. NOTE: This sheet is a summary. It may not cover all possible information. If you have questions about this medicine, talk to your doctor, pharmacist, or health care provider.  2021 Elsevier/Gold Standard (2016-05-26 20:21:10)  

## 2020-09-05 ENCOUNTER — Encounter (HOSPITAL_COMMUNITY): Payer: Self-pay

## 2020-09-05 ENCOUNTER — Inpatient Hospital Stay (HOSPITAL_COMMUNITY)
Admission: RE | Admit: 2020-09-05 | Discharge: 2020-09-05 | Disposition: A | Discharge status: Home / Self Care | Payer: Medicare Other | Source: Ambulatory Visit | Attending: Nephrology | Admitting: Nephrology

## 2020-10-03 ENCOUNTER — Encounter (HOSPITAL_COMMUNITY): Payer: Medicare Other

## 2020-11-20 ENCOUNTER — Other Ambulatory Visit (HOSPITAL_COMMUNITY): Payer: Self-pay | Admitting: *Deleted

## 2020-11-21 ENCOUNTER — Other Ambulatory Visit: Payer: Self-pay

## 2020-11-21 ENCOUNTER — Encounter (HOSPITAL_COMMUNITY)
Admission: RE | Admit: 2020-11-21 | Discharge: 2020-11-21 | Disposition: A | Discharge status: Home / Self Care | Payer: Medicare Other | Source: Ambulatory Visit | Attending: Nephrology | Admitting: Nephrology

## 2020-11-21 VITALS — BP 138/60 | HR 56 | Temp 98.5°F | Resp 20

## 2020-11-21 DIAGNOSIS — N184 Chronic kidney disease, stage 4 (severe): Secondary | ICD-10-CM | POA: Insufficient documentation

## 2020-11-21 LAB — POCT HEMOGLOBIN-HEMACUE: Hemoglobin: 9 g/dL — ABNORMAL LOW (ref 13.0–17.0)

## 2020-11-21 MED ORDER — EPOETIN ALFA-EPBX 10000 UNIT/ML IJ SOLN
INTRAMUSCULAR | Status: AC
  Administered 2020-11-21: 15000 [IU] via SUBCUTANEOUS
  Filled 2020-11-21: qty 2

## 2020-11-21 MED ORDER — SODIUM CHLORIDE 0.9 % IV SOLN
510.0000 mg | Freq: Once | INTRAVENOUS | Status: AC
  Administered 2020-11-21: 510 mg via INTRAVENOUS
  Filled 2020-11-21: qty 510

## 2020-11-21 MED ORDER — EPOETIN ALFA-EPBX 10000 UNIT/ML IJ SOLN: 15000.0000 [IU] | INTRAMUSCULAR | Status: DC

## 2020-12-19 ENCOUNTER — Other Ambulatory Visit: Payer: Self-pay

## 2020-12-19 ENCOUNTER — Encounter (HOSPITAL_COMMUNITY)
Admission: RE | Admit: 2020-12-19 | Discharge: 2020-12-19 | Disposition: A | Discharge status: Home / Self Care | Payer: Medicare Other | Source: Ambulatory Visit | Attending: Nephrology | Admitting: Nephrology

## 2020-12-19 VITALS — BP 124/56 | HR 64

## 2020-12-19 DIAGNOSIS — N184 Chronic kidney disease, stage 4 (severe): Secondary | ICD-10-CM

## 2020-12-19 LAB — RENAL FUNCTION PANEL
Albumin: 3.3 g/dL — ABNORMAL LOW (ref 3.5–5.0)
Anion gap: 12 (ref 5–15)
BUN: 75 mg/dL — ABNORMAL HIGH (ref 8–23)
CO2: 22 mmol/L (ref 22–32)
Calcium: 8.9 mg/dL (ref 8.9–10.3)
Chloride: 104 mmol/L (ref 98–111)
Creatinine, Ser: 8.16 mg/dL — ABNORMAL HIGH (ref 0.61–1.24)
GFR, Estimated: 6 mL/min — ABNORMAL LOW (ref >60)
Glucose, Bld: 122 mg/dL — ABNORMAL HIGH (ref 70–99)
Phosphorus: 4.9 mg/dL — ABNORMAL HIGH (ref 2.5–4.6)
Potassium: 4.8 mmol/L (ref 3.5–5.1)
Sodium: 138 mmol/L (ref 135–145)

## 2020-12-19 LAB — POCT HEMOGLOBIN-HEMACUE: Hemoglobin: 9.5 g/dL — ABNORMAL LOW (ref 13.0–17.0)

## 2020-12-19 MED ORDER — EPOETIN ALFA-EPBX 10000 UNIT/ML IJ SOLN
15000.0000 [IU] | INTRAMUSCULAR | Status: DC
  Administered 2020-12-19: 15000 [IU] via SUBCUTANEOUS

## 2020-12-19 MED ORDER — EPOETIN ALFA-EPBX 10000 UNIT/ML IJ SOLN
INTRAMUSCULAR | Status: AC
  Filled 2020-12-19: qty 2

## 2020-12-20 LAB — PTH, INTACT AND CALCIUM
Calcium, Total (PTH): 8.7 mg/dL (ref 8.6–10.2)
PTH: 167 pg/mL — ABNORMAL HIGH (ref 15–65)

## 2020-12-26 LAB — 25-HYDROXY VITAMIN D LCMS D2+D3
25-Hydroxy, Vitamin D-2: <1 ng/mL
25-Hydroxy, Vitamin D-3: 34 ng/mL
25-Hydroxy, Vitamin D: 34 ng/mL

## 2021-01-16 ENCOUNTER — Encounter (HOSPITAL_COMMUNITY)
Admission: RE | Admit: 2021-01-16 | Discharge: 2021-01-16 | Disposition: A | Discharge status: Home / Self Care | Payer: Medicare Other | Source: Ambulatory Visit | Attending: Nephrology | Admitting: Nephrology

## 2021-01-16 VITALS — BP 161/63 | HR 59 | Resp 14

## 2021-01-16 DIAGNOSIS — N184 Chronic kidney disease, stage 4 (severe): Secondary | ICD-10-CM | POA: Diagnosis present

## 2021-01-16 LAB — POCT HEMOGLOBIN-HEMACUE: Hemoglobin: 10.2 g/dL — ABNORMAL LOW (ref 13.0–17.0)

## 2021-01-16 MED ORDER — EPOETIN ALFA-EPBX 10000 UNIT/ML IJ SOLN
INTRAMUSCULAR | Status: AC
  Administered 2021-01-16: 15000 [IU] via SUBCUTANEOUS
  Filled 2021-01-16: qty 2

## 2021-01-16 MED ORDER — EPOETIN ALFA-EPBX 10000 UNIT/ML IJ SOLN: 15000.0000 [IU] | INTRAMUSCULAR | Status: DC

## 2021-02-13 ENCOUNTER — Other Ambulatory Visit: Payer: Self-pay

## 2021-02-13 ENCOUNTER — Ambulatory Visit (HOSPITAL_COMMUNITY)
Admission: RE | Admit: 2021-02-13 | Discharge: 2021-02-13 | Disposition: A | Discharge status: Home / Self Care | Payer: Medicare Other | Source: Ambulatory Visit | Attending: Nephrology | Admitting: Nephrology

## 2021-02-13 VITALS — BP 171/75 | HR 69 | Temp 97.2°F | Resp 18

## 2021-02-13 DIAGNOSIS — N184 Chronic kidney disease, stage 4 (severe): Secondary | ICD-10-CM | POA: Diagnosis not present

## 2021-02-13 LAB — IRON AND TIBC
Iron: 67 ug/dL (ref 45–182)
Saturation Ratios: 17 % — ABNORMAL LOW (ref 17.9–39.5)
TIBC: 406 ug/dL (ref 250–450)
UIBC: 339 ug/dL

## 2021-02-13 LAB — FERRITIN: Ferritin: 569 ng/mL — ABNORMAL HIGH (ref 24–336)

## 2021-02-13 LAB — POCT HEMOGLOBIN-HEMACUE: Hemoglobin: 10.9 g/dL — ABNORMAL LOW (ref 13.0–17.0)

## 2021-02-13 MED ORDER — EPOETIN ALFA-EPBX 10000 UNIT/ML IJ SOLN
15000.0000 [IU] | INTRAMUSCULAR | Status: DC
  Administered 2021-02-13: 15000 [IU] via SUBCUTANEOUS

## 2021-02-13 MED ORDER — EPOETIN ALFA-EPBX 10000 UNIT/ML IJ SOLN
INTRAMUSCULAR | Status: AC
  Filled 2021-02-13: qty 2

## 2021-02-13 MED ORDER — CLONIDINE HCL 0.1 MG PO TABS: 0.1000 mg | ORAL_TABLET | Freq: Once | ORAL | Status: DC | PRN

## 2021-03-13 ENCOUNTER — Encounter (HOSPITAL_COMMUNITY): Payer: Medicare Other

## 2021-03-24 ENCOUNTER — Other Ambulatory Visit: Payer: Self-pay | Admitting: Cardiovascular Disease

## 2021-08-26 ENCOUNTER — Emergency Department (HOSPITAL_BASED_OUTPATIENT_CLINIC_OR_DEPARTMENT_OTHER)
Admission: EM | Admit: 2021-08-26 | Discharge: 2021-08-26 | Disposition: A | Discharge status: Home / Self Care | Payer: Medicare Other | Attending: Emergency Medicine | Admitting: Emergency Medicine

## 2021-08-26 ENCOUNTER — Encounter (HOSPITAL_BASED_OUTPATIENT_CLINIC_OR_DEPARTMENT_OTHER): Payer: Self-pay

## 2021-08-26 ENCOUNTER — Emergency Department (HOSPITAL_BASED_OUTPATIENT_CLINIC_OR_DEPARTMENT_OTHER): Payer: Medicare Other

## 2021-08-26 ENCOUNTER — Other Ambulatory Visit: Payer: Self-pay

## 2021-08-26 DIAGNOSIS — S52022A Displaced fracture of olecranon process without intraarticular extension of left ulna, initial encounter for closed fracture: Secondary | ICD-10-CM | POA: Diagnosis not present

## 2021-08-26 DIAGNOSIS — W01198A Fall on same level from slipping, tripping and stumbling with subsequent striking against other object, initial encounter: Secondary | ICD-10-CM | POA: Insufficient documentation

## 2021-08-26 DIAGNOSIS — I4891 Unspecified atrial fibrillation: Secondary | ICD-10-CM | POA: Insufficient documentation

## 2021-08-26 DIAGNOSIS — Z7901 Long term (current) use of anticoagulants: Secondary | ICD-10-CM | POA: Insufficient documentation

## 2021-08-26 DIAGNOSIS — S52125A Nondisplaced fracture of head of left radius, initial encounter for closed fracture: Secondary | ICD-10-CM | POA: Diagnosis not present

## 2021-08-26 DIAGNOSIS — S60511A Abrasion of right hand, initial encounter: Secondary | ICD-10-CM | POA: Insufficient documentation

## 2021-08-26 DIAGNOSIS — D638 Anemia in other chronic diseases classified elsewhere: Secondary | ICD-10-CM | POA: Insufficient documentation

## 2021-08-26 DIAGNOSIS — Y92007 Garden or yard of unspecified non-institutional (private) residence as the place of occurrence of the external cause: Secondary | ICD-10-CM | POA: Diagnosis not present

## 2021-08-26 DIAGNOSIS — N185 Chronic kidney disease, stage 5: Secondary | ICD-10-CM | POA: Diagnosis not present

## 2021-08-26 DIAGNOSIS — S0191XA Laceration without foreign body of unspecified part of head, initial encounter: Secondary | ICD-10-CM

## 2021-08-26 DIAGNOSIS — S59902A Unspecified injury of left elbow, initial encounter: Secondary | ICD-10-CM | POA: Diagnosis present

## 2021-08-26 DIAGNOSIS — S0181XA Laceration without foreign body of other part of head, initial encounter: Secondary | ICD-10-CM | POA: Diagnosis not present

## 2021-08-26 DIAGNOSIS — W19XXXA Unspecified fall, initial encounter: Secondary | ICD-10-CM

## 2021-08-26 LAB — CBC WITH DIFFERENTIAL/PLATELET
Abs Immature Granulocytes: 0.03 10*3/uL (ref 0.00–0.07)
Basophils Absolute: 0 10*3/uL (ref 0.0–0.1)
Basophils Relative: 0 %
Eosinophils Absolute: 0.4 10*3/uL (ref 0.0–0.5)
Eosinophils Relative: 6 %
HCT: 25.3 % — ABNORMAL LOW (ref 39.0–52.0)
Hemoglobin: 8 g/dL — ABNORMAL LOW (ref 13.0–17.0)
Immature Granulocytes: 0 %
Lymphocytes Relative: 16 %
Lymphs Abs: 1.1 10*3/uL (ref 0.7–4.0)
MCH: 27.9 pg (ref 26.0–34.0)
MCHC: 31.6 g/dL (ref 30.0–36.0)
MCV: 88.2 fL (ref 80.0–100.0)
Monocytes Absolute: 0.4 10*3/uL (ref 0.1–1.0)
Monocytes Relative: 5 %
Neutro Abs: 4.8 10*3/uL (ref 1.7–7.7)
Neutrophils Relative %: 73 %
Platelets: 148 10*3/uL — ABNORMAL LOW (ref 150–400)
RBC: 2.87 MIL/uL — ABNORMAL LOW (ref 4.22–5.81)
RDW: 13.8 % (ref 11.5–15.5)
WBC: 6.7 10*3/uL (ref 4.0–10.5)
nRBC: 0 % (ref 0.0–0.2)

## 2021-08-26 LAB — BASIC METABOLIC PANEL
Anion gap: 11 (ref 5–15)
BUN: 69 mg/dL — ABNORMAL HIGH (ref 8–23)
CO2: 21 mmol/L — ABNORMAL LOW (ref 22–32)
Calcium: 7.8 mg/dL — ABNORMAL LOW (ref 8.9–10.3)
Chloride: 105 mmol/L (ref 98–111)
Creatinine, Ser: 9.44 mg/dL — ABNORMAL HIGH (ref 0.61–1.24)
GFR, Estimated: 5 mL/min — ABNORMAL LOW (ref >60)
Glucose, Bld: 174 mg/dL — ABNORMAL HIGH (ref 70–99)
Potassium: 4.6 mmol/L (ref 3.5–5.1)
Sodium: 137 mmol/L (ref 135–145)

## 2021-08-26 MED ORDER — OXYCODONE HCL 5 MG PO TABS: 2.5000 mg | ORAL_TABLET | Freq: Four times a day (QID) | ORAL | 0 refills | Status: DC | PRN

## 2021-08-26 MED ORDER — LIDOCAINE-EPINEPHRINE (PF) 2 %-1:200000 IJ SOLN
10.0000 mL | Freq: Once | INTRAMUSCULAR | Status: AC
  Administered 2021-08-26: 10 mL
  Filled 2021-08-26: qty 20

## 2021-08-26 NOTE — ED Provider Notes (Signed)
?[facility] ?Provider Note ? ? ?CSN: [id number] ?Arrival date & time: 08/26/21  1136 ? ?  ? ?History ? ?Chief Complaint  ?Patient presents with  ? Fall  ? ? ?David Leblanc is a 71 y.o. male. ? ?Patient presents to the emergency department today for evaluation of fall.  Patient states that he was outside in his yard and fell striking the left side of his head.  He also fell onto his left arm and has pain in the left elbow area.  Denies lower extremity pain, abdominal pain, chest pain.  No preceding lightheadedness or dizziness per his report.  He has a history of atrial fibrillation but states that he is not currently taking anticoagulation.  Denies neck pain.  Fall occurred about an hour ago and he has not been confused or had vomiting.  No worsening headache. ? ? ?  ? ?Home Medications ?Prior to Admission medications   ?Medication Sig Start Date End Date Taking? Authorizing Provider  ?apixaban (ELIQUIS) 5 MG TABS tablet Take 1 tablet (5 mg total) by mouth 2 (two) times daily. 12/08/19   Josue Hector, MD  ?carvedilol (COREG) 25 MG tablet TAKE 1 TABLET BY MOUTH TWICE DAILY WITH A MEAL. Please make yearly appt with [name] for March 2022 for future refills. Thank you 1st attempt 03/28/20   Josue Hector, MD  ?diltiazem (CARDIZEM LA) 240 MG 24 hr tablet Take 1 tablet (240 mg total) by mouth daily. 01/21/19   Josue Hector, MD  ?furosemide (LASIX) 40 MG tablet Take 1 tablet by mouth twice daily 03/25/21   Josue Hector, MD  ?IRON PO Take 65 mg by mouth 2 (two) times daily.    [provider]  ?Multiple Vitamin (MULTIVITAMIN WITH MINERALS) TABS tablet Take 1 tablet by mouth daily.    [provider]  ?sodium bicarbonate 650 MG tablet Take 650 mg by mouth 2 (two) times daily.    [provider]  ?   ? ?Allergies    ?Patient has no known allergies.   ? ?Review of Systems   ?Review of Systems ? ?Physical Exam ?Updated Vital Signs ?BP (!) 166/67 (BP Location:  Right Arm)   Pulse (!) 56   Temp 97.9 ?F (36.6 ?C) (Oral)   Resp 18   Ht 5\' 6"  (1.676 m)   Wt 68.9 kg   SpO2 98%   BMI 24.53 kg/m?  ?Physical Exam ?Vitals and nursing note reviewed.  ?Constitutional:   ?   Appearance: He is well-developed.  ?HENT:  ?   Head: Normocephalic. No raccoon eyes or Battle's sign.  ?   Comments: There is a 1 to 2 cm abrasion to the left temporal forehead area with a clean base, not actively bleeding.  Adjacent to this there is a 1 cm laceration with mild gaping of the edges of the wound.  No bleeding.  Wound base is clean. ?   Right Ear: Tympanic membrane, ear canal and external ear normal. No hemotympanum.  ?   Left Ear: Tympanic membrane, ear canal and external ear normal. No hemotympanum.  ?   Nose: Nose normal.  ?Eyes:  ?   General: Lids are normal.  ?   Conjunctiva/sclera: Conjunctivae normal.  ?   Pupils: Pupils are equal, round, and reactive to light.  ?   Comments: No visible hyphema  ?Cardiovascular:  ?   Rate and Rhythm: Normal rate and regular rhythm.  ?Pulmonary:  ?  Effort: Pulmonary effort is normal.  ?   Breath sounds: Normal breath sounds.  ?Abdominal:  ?   Palpations: Abdomen is soft.  ?   Tenderness: There is no abdominal tenderness.  ?Musculoskeletal:     ?   General: Normal range of motion.  ?   Cervical back: Normal range of motion and neck supple. No tenderness or bony tenderness.  ?   Thoracic back: No tenderness or bony tenderness.  ?   Lumbar back: No tenderness or bony tenderness.  ?   Right lower leg: No edema.  ?   Left lower leg: No edema.  ?   Comments: Patient is able to flex at both hips without difficulty while lying in bed.  Left shoulder without tenderness and full range of motion.  Left upper arm without tenderness.  Left elbow with decreased range of motion and tenderness.  Forearm without swelling or tenderness.  He does have an abrasion to the distal forearm.  Also some small scattered abrasions to the left hand but full range of motion of  fingers and wrist. ? ?Right upper extremity is intact.  Mild abrasion to the right hand.  ?Skin: ?   General: Skin is warm and dry.  ?Neurological:  ?   Mental Status: He is alert and oriented to person, place, and time.  ?   GCS: GCS eye subscore is 4. GCS verbal subscore is 5. GCS motor subscore is 6.  ?   Cranial Nerves: No cranial nerve deficit.  ?   Sensory: No sensory deficit.  ?   Coordination: Coordination normal.  ?   Deep Tendon Reflexes: Reflexes are normal and symmetric.  ? ? ?ED Results / Procedures / Treatments   ?Labs ?(all labs ordered are listed, but only abnormal results are displayed) ?Labs Reviewed  ?CBC WITH DIFFERENTIAL/PLATELET - Abnormal; Notable for the following components:  ?    Result Value  ? RBC 2.87 (*)   ? Hemoglobin 8.0 (*)   ? HCT 25.3 (*)   ? Platelets 148 (*)   ? All other components within normal limits  ?BASIC METABOLIC PANEL - Abnormal; Notable for the following components:  ? CO2 21 (*)   ? Glucose, Bld 174 (*)   ? BUN 69 (*)   ? Creatinine, Ser 9.44 (*)   ? Calcium 7.8 (*)   ? GFR, Estimated 5 (*)   ? All other components within normal limits  ? ? ?EKG ?EKG Interpretation ? ?Date/Time:  Monday Aug 26 2021 12:12:40 EDT ?Ventricular Rate:  58 ?PR Interval:  177 ?QRS Duration: 117 ?QT Interval:  487 ?QTC Calculation: 479 ?R Axis:   -4 ?Text Interpretation: Sinus rhythm LAD, consider left anterior fascicular block Left ventricular hypertrophy No significant change since last tracing Confirmed by Fredia Sorrow [id number]) on 08/26/2021 12:34:35 PM ? ?Radiology ?DG Elbow Complete Left ? ?Result Date: 08/26/2021 ?CLINICAL DATA:  Patient fell.  Left elbow pain. EXAM: LEFT ELBOW - COMPLETE 3+ VIEW COMPARISON:  None Available. FINDINGS: Four view study. Transverse fracture through the proximal ulna results in electro non free fragment. AP and oblique view show cortical fragment adjacent to the medial epicondyle, compatible with an associated avulsion injury. Cortical off step in the radial  neck on the AP projection raises the question of a nondisplaced radial neck fracture. No discernible fracture line within the articular cortex of the radial head. Elevation of the anterior posterior fat pads on the lateral projection is consistent with joint effusion. IMPRESSION: 1.  Transverse fracture through the proximal ulna at the joint results in olecranon process as a free fragment. Associated joint effusion evident. 2. Small cortical fragment adjacent to the medial epicondyle suggests associated avulsion injury. 3. Question nondisplaced radial neck fracture. Electronically Signed   By: Misty Stanley M.D.   On: 08/26/2021 13:47  ? ?CT Head Wo Contrast ? ?Result Date: 08/26/2021 ?CLINICAL DATA:  Trauma, fall EXAM: CT HEAD WITHOUT CONTRAST TECHNIQUE: Contiguous axial images were obtained from the base of the skull through the vertex without intravenous contrast. RADIATION DOSE REDUCTION: This exam was performed according to the departmental dose-optimization program which includes automated exposure control, adjustment of the mA and/or kV according to patient size and/or use of iterative reconstruction technique. COMPARISON:  None Available. FINDINGS: Brain: No acute intracranial findings are seen. There are no signs of bleeding within the cranium. Cortical sulci are prominent. There is decreased density in the subcortical and periventricular white matter. Vascular: Unremarkable. Skull: No fracture is seen in the calvarium. There is soft tissue swelling in the left frontal scalp, possibly contusion. Sinuses/Orbits: There is mild mucosal thickening in the ethmoid and right maxillary sinuses. Other: None IMPRESSION: No acute intracranial findings are seen. Atrophy. Small-vessel disease. Mild chronic sinusitis. Electronically Signed   By: Elmer Picker M.D.   On: 08/26/2021 12:58   ? ?Procedures ?Marland Kitchen.Laceration Repair ? ?Date/Time: 08/26/2021 3:10 PM ?Performed by: Carlisle Cater, PA-C ?Authorized by: Carlisle Cater, PA-C  ? ?Consent:  ?  Consent obtained:  Verbal ?  Consent given by:  Patient ?  Risks discussed:  Infection and pain ?  Alternatives discussed:  No treatment ?Universal protocol:  ?  Patient identity confirme

## 2021-08-26 NOTE — Discharge Instructions (Signed)
Please read and follow all provided instructions. ? ?Your diagnoses today include:  ?1. Fall, initial encounter   ?2. Laceration of head without foreign body, unspecified part of head, initial encounter   ?3. Closed fracture of olecranon process of left ulna, initial encounter   ?4. Closed nondisplaced fracture of head of left radius, initial encounter   ?5. Stage 5 chronic kidney disease not on chronic dialysis ([place])   ?6. Anemia of chronic disease   ? ? ?Tests performed today include: ?CT scan of your head that did not show any serious injury. ?X-ray of your elbow that shows 2 broken bones ?Complete blood cell count: Shows low red blood cell count ?Basic metabolic panel: Shows continued very poor kidney function ?Vital signs. See below for your results today.  ? ?Medications prescribed:  ?Oxycodone - narcotic pain medication ? ?DO NOT drive or perform any activities that require you to be awake and alert because this medicine can make you drowsy.  ? ?Use pain medication only under direct supervision at the lowest possible dose needed to control your pain.  ? ?Home care instructions:  ?Follow any educational materials contained in this packet. ? ?BE VERY CAREFUL not to take multiple medicines containing Tylenol (also called acetaminophen). Doing so can lead to an overdose which can damage your liver and cause liver failure and possibly death.  ? ?Follow-up instructions: ?Please follow-up with your doctor later this week for reevaluation of your injuries and kidney function.  Also see the orthopedist doctor listed for evaluation of your broken elbow. ? ?Return instructions:  ?SEEK IMMEDIATE MEDICAL ATTENTION IF: ?There is confusion or drowsiness (although children frequently become drowsy after injury).  ?You cannot awaken the injured person.  ?You have more than one episode of vomiting.  ?You notice dizziness or unsteadiness which is getting worse, or inability to walk.  ?You have convulsions or unconsciousness.   ?You experience severe, persistent headaches not relieved by Tylenol. ?You cannot use arms or legs normally.  ?There are changes in pupil sizes. (This is the black center in the colored part of the eye)  ?There is clear or bloody discharge from the nose or ears.  ?You have change in speech, vision, swallowing, or understanding.  ?Localized weakness, numbness, tingling, or change in bowel or bladder control. ?You have any other emergent concerns. ? ?Additional Information: ?You have had a head injury which does not appear to require admission at this time. ? ?Your vital signs today were: ?BP (!) 173/66   Pulse (!) 57   Temp 97.9 ?F (36.6 ?C) (Oral)   Resp 17   Ht 5\' 6"  (1.676 m)   Wt 68.9 kg   SpO2 97%   BMI 24.53 kg/m?  ?If your blood pressure (BP) was elevated above 135/85 this visit, please have this repeated by your doctor within one month. ?-------------- ? ?

## 2021-08-26 NOTE — ED Notes (Signed)
Pt NAD, a/ox4. Pt verbalizes understanding of all DC and f/u instructions. All questions answered. Pt walks with steady gait to lobby at DC.  ? ?

## 2021-08-26 NOTE — ED Notes (Signed)
Suture cart set up at patient bedside. ?

## 2021-08-26 NOTE — ED Triage Notes (Addendum)
Pt presenting with CC of fall. Pt states he was on the backyard and tripped on  the stairs. Pt states he hit his head and landed on his left arm and both hands. Pt states he has a headache and left arm pain. Head laceration , and hand abrasions noted.   ?

## 2021-08-26 NOTE — ED Provider Notes (Signed)
I provided a substantive portion of the care of this patient.  I personally performed the entirety of the history, exam, and medical decision making for this encounter. ? ?EKG Interpretation ? ?Date/Time:  Monday Aug 26 2021 12:12:40 EDT ?Ventricular Rate:  58 ?PR Interval:  177 ?QRS Duration: 117 ?QT Interval:  487 ?QTC Calculation: 479 ?R Axis:   -39 ?Text Interpretation: Sinus rhythm LAD, consider left anterior fascicular block Left ventricular hypertrophy No significant change since last tracing Confirmed by Fredia Sorrow [phone]) on 08/26/2021 12:34:35 PM ? ?Results for orders placed or performed during the hospital encounter of 08/26/21  ?CBC with Differential  ?Result Value Ref Range  ? WBC 6.7 4.0 - 10.5 K/uL  ? RBC 2.87 (L) 4.22 - 5.81 MIL/uL  ? Hemoglobin 8.0 (L) 13.0 - 17.0 g/dL  ? HCT 25.3 (L) 39.0 - 52.0 %  ? MCV 88.2 80.0 - 100.0 fL  ? MCH 27.9 26.0 - 34.0 pg  ? MCHC 31.6 30.0 - 36.0 g/dL  ? RDW 13.8 11.5 - 15.5 %  ? Platelets 148 (L) 150 - 400 K/uL  ? nRBC 0.0 0.0 - 0.2 %  ? Neutrophils Relative % 73 %  ? Neutro Abs 4.8 1.7 - 7.7 K/uL  ? Lymphocytes Relative 16 %  ? Lymphs Abs 1.1 0.7 - 4.0 K/uL  ? Monocytes Relative 5 %  ? Monocytes Absolute 0.4 0.1 - 1.0 K/uL  ? Eosinophils Relative 6 %  ? Eosinophils Absolute 0.4 0.0 - 0.5 K/uL  ? Basophils Relative 0 %  ? Basophils Absolute 0.0 0.0 - 0.1 K/uL  ? Immature Granulocytes 0 %  ? Abs Immature Granulocytes 0.03 0.00 - 0.07 K/uL  ?Basic metabolic panel  ?Result Value Ref Range  ? Sodium 137 135 - 145 mmol/L  ? Potassium 4.6 3.5 - 5.1 mmol/L  ? Chloride 105 98 - 111 mmol/L  ? CO2 21 (L) 22 - 32 mmol/L  ? Glucose, Bld 174 (H) 70 - 99 mg/dL  ? BUN 69 (H) 8 - 23 mg/dL  ? Creatinine, Ser 9.44 (H) 0.61 - 1.24 mg/dL  ? Calcium 7.8 (L) 8.9 - 10.3 mg/dL  ? GFR, Estimated 5 (L) >60 mL/min  ? Anion gap 11 5 - 15  ? ?DG Elbow Complete Left ? ?Result Date: 08/26/2021 ?CLINICAL DATA:  Patient fell.  Left elbow pain. EXAM: LEFT ELBOW - COMPLETE 3+ VIEW COMPARISON:  None  Available. FINDINGS: Four view study. Transverse fracture through the proximal ulna results in electro non free fragment. AP and oblique view show cortical fragment adjacent to the medial epicondyle, compatible with an associated avulsion injury. Cortical off step in the radial neck on the AP projection raises the question of a nondisplaced radial neck fracture. No discernible fracture line within the articular cortex of the radial head. Elevation of the anterior posterior fat pads on the lateral projection is consistent with joint effusion. IMPRESSION: 1. Transverse fracture through the proximal ulna at the joint results in olecranon process as a free fragment. Associated joint effusion evident. 2. Small cortical fragment adjacent to the medial epicondyle suggests associated avulsion injury. 3. Question nondisplaced radial neck fracture. Electronically Signed   By: Misty Stanley M.D.   On: 08/26/2021 13:47  ? ?CT Head Wo Contrast ? ?Result Date: 08/26/2021 ?CLINICAL DATA:  Trauma, fall EXAM: CT HEAD WITHOUT CONTRAST TECHNIQUE: Contiguous axial images were obtained from the base of the skull through the vertex without intravenous contrast. RADIATION DOSE REDUCTION: This exam was performed according  to the departmental dose-optimization program which includes automated exposure control, adjustment of the mA and/or kV according to patient size and/or use of iterative reconstruction technique. COMPARISON:  None Available. FINDINGS: Brain: No acute intracranial findings are seen. There are no signs of bleeding within the cranium. Cortical sulci are prominent. There is decreased density in the subcortical and periventricular white matter. Vascular: Unremarkable. Skull: No fracture is seen in the calvarium. There is soft tissue swelling in the left frontal scalp, possibly contusion. Sinuses/Orbits: There is mild mucosal thickening in the ethmoid and right maxillary sinuses. Other: None IMPRESSION: No acute intracranial  findings are seen. Atrophy. Small-vessel disease. Mild chronic sinusitis. Electronically Signed   By: Elmer Picker M.D.   On: 08/26/2021 12:58   ? ?Patient seen by me along with the physician assistant.  Patient had a fall outside in the backyard, tripped over a sort of stone or brick stairs.  He hit his head and landed on his left arm.  Pain to the left elbow area.  And an abrasion and laceration to the left temporal area. ? ?Patient's labs White blood cell count is normal hemoglobin is a little low at 8.  Patient known to have chronic kidney disease.  His GFR is 5.  He does not want dialysis they have been following him closely.  If patient's potassium is 4.6 which is reassuring.  Lungs are clear so I do not think he is volume overloaded.  EKG without any arrhythmias.  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  No significant change. ? ?CT head was negative x-rays of the elbow definitely show a proximal olecranon fracture.  This will be treated with a long-arm splint.  Follow-up with orthopedics. ? ?Physician assistant will repair the small laceration to the left temporal forehead area. ? ?Patient stable for discharge home ? ?  ?Fredia Sorrow, MD ?08/26/21 1452 ? ?

## 2021-09-04 ENCOUNTER — Ambulatory Visit (INDEPENDENT_AMBULATORY_CARE_PROVIDER_SITE_OTHER): Payer: Medicare Other

## 2021-09-04 ENCOUNTER — Encounter: Payer: Self-pay | Admitting: Orthopedic Surgery

## 2021-09-04 ENCOUNTER — Ambulatory Visit: Payer: Medicare Other | Admitting: Orthopedic Surgery

## 2021-09-04 DIAGNOSIS — M25522 Pain in left elbow: Secondary | ICD-10-CM

## 2021-09-04 NOTE — Progress Notes (Signed)
? ?Office Visit Note ?  ?Patient: David Leblanc           ?Date of Birth: 12/20/1950           ?MRN: [id number] ?Visit Date: 09/04/2021 ?Requested by: Bonnita Nasuti, MD ?[address] ?[place],  [place] [address] ?PCP: Bonnita Nasuti, MD ? ?Subjective: ?Chief Complaint  ?Patient presents with  ? Left Elbow - Pain  ? ? ?HPI: Patient presents for evaluation of left elbow pain.  He is right-hand dominant.  Had a fall 08/26/2021.  Was diagnosed with closed olecranon fracture and was placed in a splint.  He is retired.  He does have other medical problems including atrial fibs/flutter which is rate controlled at this time.  Patient is not on blood thinners.  He also has end-stage chronic kidney disease with creatinine of 9 at the time of his emergency appointment on 08/26/2021.  He has been taking oxycodone and Tylenol 1 time only.  Patient is adamant that he does not want to have any type of dialysis.  He sees a nephrologist and internal medicine doctor who were not part of him help.  Hemoglobin 8 at the time of his emergency department appointment and creatinine 9.4. ?             ?ROS: All systems reviewed are negative as they relate to the chief complaint within the history of present illness.  Patient denies  fevers or chills. ? ? ?Assessment & Plan: ?Visit Diagnoses:  ?1. Pain in left elbow   ? ? ?Plan: Impression is displaced olecranon fracture.  In normal circumstances this is an operative fracture.  However after discussing with the anesthesiologist whether or not this patient can have anesthesia the reality is he cannot undergo anesthetic without having this creatinine issue at least temporarily addressed with dialysis.  He is unwilling to do that.  I did tell him that he would have somewhat of a floppy arm without fixation of the fracture.  There is also a time sensitive nature of this fracture in terms of the longer we wait the harder will be to achieve fracture healing and reduction.  At this time the patient  has elected to go with cast immobilization.  We will get that done today and see him back in 2 weeks.  Repeat radiographs out of the cast at that time.  We flexed him about 80 degrees in the cast. ? ?Follow-Up Instructions: Return in about 2 weeks (around 09/18/2021).  ? ?Orders:  ?Orders Placed This Encounter  ?Procedures  ? XR Elbow Complete Left (3+View)  ? ?No orders of the defined types were placed in this encounter. ? ? ? ? Procedures: ?No procedures performed ? ? ?Clinical Data: ?No additional findings. ? ?Objective: ?Vital Signs: There were no vitals taken for this visit. ? ?Physical Exam:  ? ?Constitutional: Patient appears well-developed ?HEENT:  ?Head: Normocephalic ?Eyes:EOM are normal ?Neck: Normal range of motion ?Cardiovascular: Normal rate ?Pulmonary/chest: Effort normal ?Neurologic: Patient is alert ?Skin: Skin is warm ?Psychiatric: Patient has normal mood and affect ? ? ?Ortho Exam: Ortho exam demonstrates absent or severely weakened elbow extension strength but flexion strength is intact.  EPL FPL interosseous strength intact with palpable radial pulse on the left-hand side.  Ecchymosis and bruising is present around the left elbow consistent with his fracture but there is no skin breakdown. ? ?Specialty Comments:  ?No specialty comments available. ? ?Imaging: ?XR Elbow Complete Left (3+View) ? ?Result Date: 09/04/2021 ?AP lateral  radiographs left elbow reviewed.  Again noted is a displaced olecranon fracture.  Ulnohumeral joint remains located.  No other fractures visible.  ? ? ?PMFS History: ?Patient Active Problem List  ? Diagnosis Date Noted  ? CKD (chronic kidney disease) stage 4, GFR 15-29 ml/min (HCC) 08/31/2017  ? Atrial flutter ([place]) 08/07/2017  ? Atrial flutter with rapid ventricular response ([place]) 08/07/2017  ? Atrial fibrillation with rapid ventricular response ([place])   ? Essential hypertension   ? ?Past Medical History:  ?Diagnosis Date  ? Hypertension   ? Tobacco use   ?  ?Family  History  ?Problem Relation Age of Onset  ? Hypertension Father   ?  ?Past Surgical History:  ?Procedure Laterality Date  ? NO PAST SURGERIES    ? ?Social History  ? ?Occupational History  ? Not on file  ?Tobacco Use  ? Smoking status: Every Day  ?  Packs/day: 0.25  ?  Years: 40.00  ?  Pack years: 10.00  ?  Types: Cigarettes  ? Smokeless tobacco: Never  ?Vaping Use  ? Vaping Use: Never used  ?Substance and Sexual Activity  ? Alcohol use: Yes  ?  Comment: 2 shots per day  ? Drug use: Never  ? Sexual activity: Not on file  ? ? ? ? ? ?

## 2021-09-19 ENCOUNTER — Ambulatory Visit: Payer: Medicare Other | Admitting: Orthopedic Surgery

## 2021-09-19 ENCOUNTER — Encounter: Payer: Self-pay | Admitting: Orthopedic Surgery

## 2021-09-19 ENCOUNTER — Ambulatory Visit (INDEPENDENT_AMBULATORY_CARE_PROVIDER_SITE_OTHER): Payer: Medicare Other

## 2021-09-19 DIAGNOSIS — M25522 Pain in left elbow: Secondary | ICD-10-CM | POA: Diagnosis not present

## 2021-09-19 NOTE — Progress Notes (Signed)
   Post-Op Visit Note   Patient: David Leblanc           Date of Birth: 11-20-1950           MRN: [id number] Visit Date: 09/19/2021 PCP: Bonnita Nasuti, MD   Assessment & Plan:  Chief Complaint:  Chief Complaint  Patient presents with   Left Elbow - Fracture, Follow-up   Visit Diagnoses:  1. Pain in left elbow     Plan: Patient presents now 3 weeks out left elbow olecranon fracture.  Patient elected to proceed with nonoperative management after extensive discussion of the potential problems with that decision.  Cast is removed today.  He is got flexion to about 95 degrees of extension passively is almost full.  Has predictably weak extension strength.  Motor or sensory function to the left hand intact.  Radiographs show maintenance of distraction at the fracture site which is not a unexpected finding.  Plan is sling immobilization for the next 3 weeks then follow-up as needed.  He does need to be careful with any lifting in the future and he will have a weak arm moving forward.  Patient understands and wishes to proceed as tolerated with his arm  Follow-Up Instructions: Return if symptoms worsen or fail to improve.   Orders:  Orders Placed This Encounter  Procedures   XR Elbow Complete Left (3+View)   No orders of the defined types were placed in this encounter.   Imaging: XR Elbow Complete Left (3+View)  Result Date: 09/19/2021 AP lateral beak radiographs left elbow reviewed.  Again noted is mildly displaced olecranon fracture with maintenance of reduction of the ulnohumeral joint.  No acute fracture.  Fracture displacement measures about 1 cm minimum   PMFS History: Patient Active Problem List   Diagnosis Date Noted   CKD (chronic kidney disease) stage 4, GFR 15-29 ml/min (HCC) 08/31/2017   Atrial flutter ([place]) 08/07/2017   Atrial flutter with rapid ventricular response ([place]) 08/07/2017   Atrial fibrillation with rapid ventricular response ([place])    Essential hypertension     Past Medical History:  Diagnosis Date   Hypertension    Tobacco use     Family History  Problem Relation Age of Onset   Hypertension Father     Past Surgical History:  Procedure Laterality Date   NO PAST SURGERIES     Social History   Occupational History   Not on file  Tobacco Use   Smoking status: Every Day    Packs/day: 0.25    Years: 40.00    Pack years: 10.00    Types: Cigarettes   Smokeless tobacco: Never  Vaping Use   Vaping Use: Never used  Substance and Sexual Activity   Alcohol use: Yes    Comment: 2 shots per day   Drug use: Never   Sexual activity: Not on file

## 2021-10-16 DIAGNOSIS — N186 End stage renal disease: Secondary | ICD-10-CM | POA: Insufficient documentation

## 2021-10-16 DIAGNOSIS — E875 Hyperkalemia: Secondary | ICD-10-CM | POA: Insufficient documentation

## 2021-10-16 DIAGNOSIS — Z716 Tobacco abuse counseling: Secondary | ICD-10-CM | POA: Insufficient documentation

## 2021-10-16 DIAGNOSIS — E8809 Other disorders of plasma-protein metabolism, not elsewhere classified: Secondary | ICD-10-CM | POA: Insufficient documentation

## 2021-10-16 DIAGNOSIS — N189 Chronic kidney disease, unspecified: Secondary | ICD-10-CM | POA: Insufficient documentation

## 2021-10-16 DIAGNOSIS — T7840XA Allergy, unspecified, initial encounter: Secondary | ICD-10-CM | POA: Insufficient documentation

## 2021-10-16 DIAGNOSIS — L299 Pruritus, unspecified: Secondary | ICD-10-CM | POA: Insufficient documentation

## 2021-10-16 DIAGNOSIS — D509 Iron deficiency anemia, unspecified: Secondary | ICD-10-CM | POA: Insufficient documentation

## 2021-10-16 DIAGNOSIS — R52 Pain, unspecified: Secondary | ICD-10-CM | POA: Insufficient documentation

## 2021-10-16 DIAGNOSIS — E872 Acidosis, unspecified: Secondary | ICD-10-CM | POA: Insufficient documentation

## 2021-10-16 DIAGNOSIS — T782XXA Anaphylactic shock, unspecified, initial encounter: Secondary | ICD-10-CM | POA: Insufficient documentation

## 2021-10-16 DIAGNOSIS — R197 Diarrhea, unspecified: Secondary | ICD-10-CM | POA: Insufficient documentation

## 2021-10-16 DIAGNOSIS — N2581 Secondary hyperparathyroidism of renal origin: Secondary | ICD-10-CM | POA: Insufficient documentation

## 2021-10-17 DIAGNOSIS — Z992 Dependence on renal dialysis: Secondary | ICD-10-CM | POA: Insufficient documentation

## 2021-10-17 DIAGNOSIS — E559 Vitamin D deficiency, unspecified: Secondary | ICD-10-CM | POA: Insufficient documentation

## 2021-10-17 DIAGNOSIS — F17210 Nicotine dependence, cigarettes, uncomplicated: Secondary | ICD-10-CM | POA: Insufficient documentation

## 2021-10-18 DIAGNOSIS — D689 Coagulation defect, unspecified: Secondary | ICD-10-CM | POA: Insufficient documentation

## 2021-10-18 DIAGNOSIS — R0600 Dyspnea, unspecified: Secondary | ICD-10-CM | POA: Insufficient documentation

## 2021-10-18 DIAGNOSIS — Z111 Encounter for screening for respiratory tuberculosis: Secondary | ICD-10-CM | POA: Insufficient documentation

## 2021-10-18 DIAGNOSIS — T829XXA Unspecified complication of cardiac and vascular prosthetic device, implant and graft, initial encounter: Secondary | ICD-10-CM | POA: Insufficient documentation

## 2021-11-18 ENCOUNTER — Other Ambulatory Visit: Payer: Self-pay | Admitting: *Deleted

## 2021-11-18 DIAGNOSIS — N184 Chronic kidney disease, stage 4 (severe): Secondary | ICD-10-CM

## 2021-12-13 ENCOUNTER — Encounter (HOSPITAL_COMMUNITY): Payer: Self-pay

## 2021-12-16 ENCOUNTER — Encounter: Payer: Self-pay | Admitting: *Deleted

## 2021-12-16 ENCOUNTER — Ambulatory Visit (INDEPENDENT_AMBULATORY_CARE_PROVIDER_SITE_OTHER)
Admission: RE | Admit: 2021-12-16 | Discharge: 2021-12-16 | Disposition: A | Discharge status: Home / Self Care | Payer: Medicare Other | Source: Ambulatory Visit | Attending: Vascular Surgery | Admitting: Vascular Surgery

## 2021-12-16 ENCOUNTER — Other Ambulatory Visit: Payer: Self-pay | Admitting: *Deleted

## 2021-12-16 ENCOUNTER — Encounter: Payer: Self-pay | Admitting: Surgery

## 2021-12-16 ENCOUNTER — Ambulatory Visit: Payer: Medicare Other | Admitting: Surgery

## 2021-12-16 ENCOUNTER — Ambulatory Visit (HOSPITAL_COMMUNITY)
Admission: RE | Admit: 2021-12-16 | Discharge: 2021-12-16 | Disposition: A | Discharge status: Home / Self Care | Payer: Medicare Other | Source: Ambulatory Visit | Attending: Vascular Surgery | Admitting: Vascular Surgery

## 2021-12-16 VITALS — BP 156/74 | HR 60 | Temp 98.1°F | Resp 20 | Ht 66.0 in | Wt 131.0 lb

## 2021-12-16 DIAGNOSIS — N184 Chronic kidney disease, stage 4 (severe): Secondary | ICD-10-CM | POA: Diagnosis present

## 2021-12-16 DIAGNOSIS — N186 End stage renal disease: Secondary | ICD-10-CM

## 2021-12-16 DIAGNOSIS — Z992 Dependence on renal dialysis: Secondary | ICD-10-CM | POA: Diagnosis not present

## 2021-12-16 NOTE — Progress Notes (Signed)
[facility]  Patient name: David Leblanc MRN: [id number] DOB: [birth year] Sex: male   REQUESTING PROVIDER:    Dr. Hollie Salk   REASON FOR CONSULT:    Dialysis access  HISTORY OF PRESENT ILLNESS:   David Leblanc is a 71 y.o. male, who is here today for evaluation of permanent dialysis access.  He is currently dialyzing through a catheter.  He is right-handed.  He does not have a pacemaker or defibrillator.  He says his left arm is weak from a fall several months ago.  He has had issues with the bone healing.  The patient suffers from atrial fibrillation but is not on anticoagulation.  He is on dialysis Tuesday Thursday Saturday.  He is medically managed for hypertension.  He has a history of tobacco abuse.  PAST MEDICAL HISTORY    Past Medical History:  Diagnosis Date   Hypertension    Tobacco use      FAMILY HISTORY   Family History  Problem Relation Age of Onset   Hypertension Father     SOCIAL HISTORY:   Social History   Socioeconomic History   Marital status: Married    Spouse name: Not on file   Number of children: Not on file   Years of education: Not on file   Highest education level: Not on file  Occupational History   Not on file  Tobacco Use   Smoking status: Every Day    Packs/day: 0.25    Years: 40.00    Total pack years: 10.00    Types: Cigarettes   Smokeless tobacco: Never  Vaping Use   Vaping Use: Never used  Substance and Sexual Activity   Alcohol use: Yes    Comment: 2 shots per day   Drug use: Never   Sexual activity: Not on file  Other Topics Concern   Not on file  Social History Narrative   Not on file   Social Determinants of Health   Financial Resource Strain: Not on file  Food Insecurity: Not on file  Transportation Needs: Not on file  Physical Activity: Not on file  Stress: Not on file  Social Connections: Not on file  Intimate Partner Violence: Not on file     ALLERGIES:    No Known Allergies  CURRENT MEDICATIONS:    Current Outpatient Medications  Medication Sig Dispense Refill   apixaban (ELIQUIS) 5 MG TABS tablet Take 1 tablet (5 mg total) by mouth 2 (two) times daily. 60 tablet 5   carvedilol (COREG) 25 MG tablet TAKE 1 TABLET BY MOUTH TWICE DAILY WITH A MEAL. Please make yearly appt with [name] for March 2022 for future refills. Thank you 1st attempt 180 tablet 0   diltiazem (CARDIZEM LA) 240 MG 24 hr tablet Take 1 tablet (240 mg total) by mouth daily. 90 tablet 3   furosemide (LASIX) 40 MG tablet Take 1 tablet by mouth twice daily 180 tablet 0   IRON PO Take 65 mg by mouth 2 (two) times daily.     Multiple Vitamin (MULTIVITAMIN WITH MINERALS) TABS tablet Take 1 tablet by mouth daily.     oxyCODONE (OXY IR/ROXICODONE) 5 MG immediate release tablet Take 0.5-1 tablets (2.5-5 mg total) by mouth every 6 (six) hours as needed for severe pain. 6 tablet 0   sodium bicarbonate 650 MG tablet Take 650 mg by mouth 2 (two) times daily.     No current facility-administered medications for this visit.  REVIEW OF SYSTEMS:   [X]  denotes positive finding, [ ]  denotes negative finding Cardiac  Comments:  Chest pain or chest pressure:    Shortness of breath upon exertion:    Short of breath when lying flat:    Irregular heart rhythm:        Vascular    Pain in calf, thigh, or hip brought on by ambulation:    Pain in feet at night that wakes you up from your sleep:     Blood clot in your veins:    Leg swelling:         Pulmonary    Oxygen at home:    Productive cough:     Wheezing:         Neurologic    Sudden weakness in arms or legs:     Sudden numbness in arms or legs:     Sudden onset of difficulty speaking or slurred speech:    Temporary loss of vision in one eye:     Problems with dizziness:         Gastrointestinal    Blood in stool:      Vomited blood:         Genitourinary    Burning when urinating:     Blood  in urine:        Psychiatric    Major depression:         Hematologic    Bleeding problems:    Problems with blood clotting too easily:        Skin    Rashes or ulcers:        Constitutional    Fever or chills:     PHYSICAL EXAM:   Vitals:   12/16/21 1441  BP: (!) 156/74  Pulse: 60  Resp: 20  Temp: 98.1 F (36.7 C)  SpO2: 99%  Weight: 131 lb (59.4 kg)  Height: 5\' 6"  (1.676 m)    GENERAL: The patient is a well-nourished male, in no acute distress. The vital signs are documented above. CARDIAC: There is a regular rate and rhythm.  VASCULAR: Left radial pulse PULMONARY: Nonlabored respirations ABDOMEN: Soft and non-tender with normal pitched bowel sounds.  MUSCULOSKELETAL: There are no major deformities or cyanosis. NEUROLOGIC: No focal weakness or paresthesias are detected. SKIN: There are no ulcers or rashes noted. PSYCHIATRIC: The patient has a normal affect.  STUDIES:   I have reviewed the following: +-----------------+-------------+----------+--------+  Right Cephalic   Diameter (cm)Depth (cm)Findings  +-----------------+-------------+----------+--------+  Shoulder             0.16                         +-----------------+-------------+----------+--------+  Prox upper arm       0.22                         +-----------------+-------------+----------+--------+  Mid upper arm        0.24                         +-----------------+-------------+----------+--------+  Dist upper arm       0.24               lateral   +-----------------+-------------+----------+--------+  Antecubital fossa    0.20                         +-----------------+-------------+----------+--------+  Prox forearm         0.22                         +-----------------+-------------+----------+--------+  Mid forearm          0.18                         +-----------------+-------------+----------+--------+    +-----------------+-------------+----------+--------+  Right Basilic    Diameter (cm)Depth (cm)Findings  +-----------------+-------------+----------+--------+  Prox upper arm       0.20                         +-----------------+-------------+----------+--------+  Mid upper arm        0.21                         +-----------------+-------------+----------+--------+  Dist upper arm       0.18                         +-----------------+-------------+----------+--------+  Antecubital fossa    0.18                         +-----------------+-------------+----------+--------+   +-----------------+-------------+----------+--------+  Left Cephalic    Diameter (cm)Depth (cm)Findings  +-----------------+-------------+----------+--------+  Shoulder             0.22                         +-----------------+-------------+----------+--------+  Prox upper arm       0.20                         +-----------------+-------------+----------+--------+  Mid upper arm        0.24                         +-----------------+-------------+----------+--------+  Dist upper arm       0.21                         +-----------------+-------------+----------+--------+  Antecubital fossa    0.38                         +-----------------+-------------+----------+--------+  Prox forearm         0.22                         +-----------------+-------------+----------+--------+  Mid forearm          0.22                         +-----------------+-------------+----------+--------+  Dist forearm         0.09                         +-----------------+-------------+----------+--------+   +-----------------+-------------+----------+--------+  Left Basilic     Diameter (cm)Depth (cm)Findings  +-----------------+-------------+----------+--------+  Mid upper arm        0.22                          +-----------------+-------------+----------+--------+  Dist upper arm  0.27                         +-----------------+-------------+----------+--------+  Antecubital fossa    0.17                         +-----------------+-------------+----------+--------+   ASSESSMENT and PLAN   End-stage renal disease: I discussed with the patient that we need to place access in his left arm.  Based off of his vein mapping, this will likely be a left upper arm Gore-Tex graft however I will look at the veins in his arm and create a fistula if that is an option.  I talked about the risk of not maturity as well as the risk of steal syndrome.  All his questions were answered.  We will get him scheduled in the near future on a nondialysis day.   Leia Alf, MD, FACS [facility] [phone] Pager [phone]

## 2022-01-16 ENCOUNTER — Other Ambulatory Visit: Payer: Self-pay

## 2022-01-16 ENCOUNTER — Encounter (HOSPITAL_COMMUNITY): Payer: Self-pay | Admitting: Surgery

## 2022-01-16 NOTE — Progress Notes (Signed)
Mr. David Leblanc denies chest pain or shortness of breath. Patient denies having any s/s of Covid in his household, also denies any known exposure to Covid.   Mr. David Leblanc's PCP is Dr. Earnest Rosier, patient has been seen by Dr. Dennard Schaumann the past, patient reports that Dr. Jannette Fogo manages patient's cardiac issues.

## 2022-01-17 ENCOUNTER — Ambulatory Visit (HOSPITAL_BASED_OUTPATIENT_CLINIC_OR_DEPARTMENT_OTHER): Payer: Medicare Other | Admitting: Certified Registered Nurse Anesthetist

## 2022-01-17 ENCOUNTER — Other Ambulatory Visit: Payer: Self-pay

## 2022-01-17 ENCOUNTER — Encounter (HOSPITAL_COMMUNITY)
Admission: RE | Disposition: A | Discharge status: Home / Self Care | Payer: Self-pay | Source: Home / Self Care | Attending: Surgery

## 2022-01-17 ENCOUNTER — Ambulatory Visit (HOSPITAL_COMMUNITY): Payer: Medicare Other | Admitting: Certified Registered Nurse Anesthetist

## 2022-01-17 ENCOUNTER — Ambulatory Visit (HOSPITAL_COMMUNITY)
Admission: RE | Admit: 2022-01-17 | Discharge: 2022-01-17 | Disposition: A | Discharge status: Home / Self Care | Payer: Medicare Other | Attending: Surgery | Admitting: Surgery

## 2022-01-17 ENCOUNTER — Encounter (HOSPITAL_COMMUNITY): Payer: Self-pay | Admitting: Surgery

## 2022-01-17 DIAGNOSIS — I120 Hypertensive chronic kidney disease with stage 5 chronic kidney disease or end stage renal disease: Secondary | ICD-10-CM

## 2022-01-17 DIAGNOSIS — N186 End stage renal disease: Secondary | ICD-10-CM

## 2022-01-17 DIAGNOSIS — Z992 Dependence on renal dialysis: Secondary | ICD-10-CM | POA: Diagnosis not present

## 2022-01-17 DIAGNOSIS — D631 Anemia in chronic kidney disease: Secondary | ICD-10-CM | POA: Diagnosis not present

## 2022-01-17 DIAGNOSIS — Z87891 Personal history of nicotine dependence: Secondary | ICD-10-CM

## 2022-01-17 DIAGNOSIS — F17210 Nicotine dependence, cigarettes, uncomplicated: Secondary | ICD-10-CM | POA: Insufficient documentation

## 2022-01-17 DIAGNOSIS — D759 Disease of blood and blood-forming organs, unspecified: Secondary | ICD-10-CM | POA: Insufficient documentation

## 2022-01-17 DIAGNOSIS — D649 Anemia, unspecified: Secondary | ICD-10-CM | POA: Insufficient documentation

## 2022-01-17 DIAGNOSIS — N185 Chronic kidney disease, stage 5: Secondary | ICD-10-CM | POA: Diagnosis not present

## 2022-01-17 HISTORY — PX: AV FISTULA PLACEMENT: SHX1204

## 2022-01-17 HISTORY — DX: Chronic kidney disease, unspecified: N18.9

## 2022-01-17 HISTORY — DX: Anemia, unspecified: D64.9

## 2022-01-17 HISTORY — DX: End stage renal disease: N18.6

## 2022-01-17 LAB — POCT I-STAT, CHEM 8
BUN: 19 mg/dL (ref 8–23)
Calcium, Ion: 1.05 mmol/L — ABNORMAL LOW (ref 1.15–1.40)
Chloride: 97 mmol/L — ABNORMAL LOW (ref 98–111)
Creatinine, Ser: 4.2 mg/dL — ABNORMAL HIGH (ref 0.61–1.24)
Glucose, Bld: 102 mg/dL — ABNORMAL HIGH (ref 70–99)
HCT: 39 % (ref 39.0–52.0)
Hemoglobin: 13.3 g/dL (ref 13.0–17.0)
Potassium: 4.3 mmol/L (ref 3.5–5.1)
Sodium: 135 mmol/L (ref 135–145)
TCO2: 28 mmol/L (ref 22–32)

## 2022-01-17 SURGERY — ARTERIOVENOUS (AV) FISTULA CREATION
Anesthesia: Monitor Anesthesia Care | Site: Arm Upper | Laterality: Left

## 2022-01-17 MED ORDER — PHENYLEPHRINE HCL-NACL 20-0.9 MG/250ML-% IV SOLN
INTRAVENOUS | Status: DC | PRN
  Administered 2022-01-17: 25 ug/min via INTRAVENOUS

## 2022-01-17 MED ORDER — FENTANYL CITRATE (PF) 250 MCG/5ML IJ SOLN
INTRAMUSCULAR | Status: DC | PRN
  Administered 2022-01-17: 50 ug via INTRAVENOUS

## 2022-01-17 MED ORDER — CHLORHEXIDINE GLUCONATE 4 % EX LIQD: 60.0000 mL | Freq: Once | CUTANEOUS | Status: DC

## 2022-01-17 MED ORDER — CHLORHEXIDINE GLUCONATE 0.12 % MT SOLN: 15.0000 mL | Freq: Once | OROMUCOSAL | Status: AC

## 2022-01-17 MED ORDER — HEPARIN 6000 UNIT IRRIGATION SOLUTION
Status: DC | PRN
  Administered 2022-01-17: 1

## 2022-01-17 MED ORDER — SODIUM CHLORIDE 0.9 % IV SOLN: INTRAVENOUS | Status: DC

## 2022-01-17 MED ORDER — MIDAZOLAM HCL 2 MG/2ML IJ SOLN
INTRAMUSCULAR | Status: AC
  Filled 2022-01-17: qty 2

## 2022-01-17 MED ORDER — SODIUM CHLORIDE 0.9 % IV SOLN: INTRAVENOUS | Status: DC | PRN

## 2022-01-17 MED ORDER — PROPOFOL 500 MG/50ML IV EMUL
INTRAVENOUS | Status: DC | PRN
  Administered 2022-01-17: 75 ug/kg/min via INTRAVENOUS

## 2022-01-17 MED ORDER — EPHEDRINE SULFATE-NACL 50-0.9 MG/10ML-% IV SOSY
PREFILLED_SYRINGE | INTRAVENOUS | Status: DC | PRN
  Administered 2022-01-17: 5 mg via INTRAVENOUS

## 2022-01-17 MED ORDER — MIDAZOLAM HCL 2 MG/2ML IJ SOLN
INTRAMUSCULAR | Status: DC | PRN
  Administered 2022-01-17 (×2): 1 mg via INTRAVENOUS

## 2022-01-17 MED ORDER — CEFAZOLIN SODIUM-DEXTROSE 2-4 GM/100ML-% IV SOLN
2.0000 g | INTRAVENOUS | Status: AC
  Administered 2022-01-17: 2 g via INTRAVENOUS
  Filled 2022-01-17: qty 100

## 2022-01-17 MED ORDER — PHENYLEPHRINE 80 MCG/ML (10ML) SYRINGE FOR IV PUSH (FOR BLOOD PRESSURE SUPPORT)
PREFILLED_SYRINGE | INTRAVENOUS | Status: AC
  Filled 2022-01-17: qty 10

## 2022-01-17 MED ORDER — ORAL CARE MOUTH RINSE: 15.0000 mL | Freq: Once | OROMUCOSAL | Status: AC

## 2022-01-17 MED ORDER — EPHEDRINE 5 MG/ML INJ
INTRAVENOUS | Status: AC
  Filled 2022-01-17: qty 5

## 2022-01-17 MED ORDER — MEPIVACAINE HCL (PF) 2 % IJ SOLN
INTRAMUSCULAR | Status: DC | PRN
  Administered 2022-01-17: 20 mL

## 2022-01-17 MED ORDER — HYDROCODONE-ACETAMINOPHEN 5-325 MG PO TABS: 1.0000 | ORAL_TABLET | Freq: Four times a day (QID) | ORAL | 0 refills | Status: DC | PRN

## 2022-01-17 MED ORDER — PROPOFOL 10 MG/ML IV BOLUS
INTRAVENOUS | Status: AC
  Filled 2022-01-17: qty 20

## 2022-01-17 MED ORDER — PHENYLEPHRINE 80 MCG/ML (10ML) SYRINGE FOR IV PUSH (FOR BLOOD PRESSURE SUPPORT)
PREFILLED_SYRINGE | INTRAVENOUS | Status: DC | PRN
  Administered 2022-01-17: 80 ug via INTRAVENOUS
  Administered 2022-01-17: 160 ug via INTRAVENOUS
  Administered 2022-01-17: 80 ug via INTRAVENOUS
  Administered 2022-01-17: 160 ug via INTRAVENOUS

## 2022-01-17 MED ORDER — HEPARIN 6000 UNIT IRRIGATION SOLUTION
Status: AC
  Filled 2022-01-17: qty 500

## 2022-01-17 MED ORDER — ROPIVACAINE HCL 5 MG/ML IJ SOLN
INTRAMUSCULAR | Status: DC | PRN
  Administered 2022-01-17: 10 mL via PERINEURAL

## 2022-01-17 MED ORDER — LIDOCAINE-EPINEPHRINE (PF) 1 %-1:200000 IJ SOLN
INTRAMUSCULAR | Status: AC
  Filled 2022-01-17: qty 30

## 2022-01-17 MED ORDER — CHLORHEXIDINE GLUCONATE 0.12 % MT SOLN
OROMUCOSAL | Status: AC
  Administered 2022-01-17: 15 mL via OROMUCOSAL
  Filled 2022-01-17: qty 15

## 2022-01-17 MED ORDER — 0.9 % SODIUM CHLORIDE (POUR BTL) OPTIME
TOPICAL | Status: DC | PRN
  Administered 2022-01-17: 1000 mL

## 2022-01-17 MED ORDER — FENTANYL CITRATE (PF) 250 MCG/5ML IJ SOLN
INTRAMUSCULAR | Status: AC
  Filled 2022-01-17: qty 5

## 2022-01-17 SURGICAL SUPPLY — 30 items
ARMBAND PINK RESTRICT EXTREMIT (MISCELLANEOUS) ×2 IMPLANT
BAG COUNTER SPONGE SURGICOUNT (BAG) ×1 IMPLANT
CANISTER SUCT 3000ML PPV (MISCELLANEOUS) ×1 IMPLANT
CLIP VESOCCLUDE MED 6/CT (CLIP) ×1 IMPLANT
CLIP VESOCCLUDE SM WIDE 6/CT (CLIP) ×1 IMPLANT
COVER PROBE W GEL 5X96 (DRAPES) ×1 IMPLANT
DERMABOND ADVANCED .7 DNX12 (GAUZE/BANDAGES/DRESSINGS) ×1 IMPLANT
ELECT REM PT RETURN 9FT ADLT (ELECTROSURGICAL) ×1
ELECTRODE REM PT RTRN 9FT ADLT (ELECTROSURGICAL) ×1 IMPLANT
GLOVE SURG SS PI 7.5 STRL IVOR (GLOVE) ×3 IMPLANT
GOWN STRL REUS W/ TWL LRG LVL3 (GOWN DISPOSABLE) ×2 IMPLANT
GOWN STRL REUS W/ TWL XL LVL3 (GOWN DISPOSABLE) ×1 IMPLANT
GOWN STRL REUS W/TWL LRG LVL3 (GOWN DISPOSABLE) ×2
GOWN STRL REUS W/TWL XL LVL3 (GOWN DISPOSABLE) ×1
HEMOSTAT SNOW SURGICEL 2X4 (HEMOSTASIS) IMPLANT
KIT BASIN OR (CUSTOM PROCEDURE TRAY) ×1 IMPLANT
KIT TURNOVER KIT B (KITS) ×1 IMPLANT
NS IRRIG 1000ML POUR BTL (IV SOLUTION) ×1 IMPLANT
PACK CV ACCESS (CUSTOM PROCEDURE TRAY) ×1 IMPLANT
PAD ARMBOARD 7.5X6 YLW CONV (MISCELLANEOUS) ×2 IMPLANT
SLING ARM FOAM STRAP LRG (SOFTGOODS) IMPLANT
SLING ARM FOAM STRAP MED (SOFTGOODS) IMPLANT
SPONGE T-LAP 18X18 ~~LOC~~+RFID (SPONGE) IMPLANT
SUT PROLENE 6 0 CC (SUTURE) ×1 IMPLANT
SUT VIC AB 3-0 SH 27 (SUTURE) ×1
SUT VIC AB 3-0 SH 27X BRD (SUTURE) ×1 IMPLANT
SUT VICRYL 4-0 PS2 18IN ABS (SUTURE) ×1 IMPLANT
TOWEL GREEN STERILE (TOWEL DISPOSABLE) ×1 IMPLANT
UNDERPAD 30X36 HEAVY ABSORB (UNDERPADS AND DIAPERS) ×1 IMPLANT
WATER STERILE IRR 1000ML POUR (IV SOLUTION) ×1 IMPLANT

## 2022-01-17 NOTE — H&P (Signed)
[facility]   Patient name: David Leblanc   MRN: [id number]        DOB: [birth year]          Sex: male     REQUESTING PROVIDER:     Dr. Hollie Salk     REASON FOR CONSULT:    Dialysis access   HISTORY OF PRESENT ILLNESS:    Eldor Conaway is a 71 y.o. male, who is here today for evaluation of permanent dialysis access.  He is currently dialyzing through a catheter.  He is right-handed.  He does not have a pacemaker or defibrillator.  He says his left arm is weak from a fall several months ago.  He has had issues with the bone healing.   The patient suffers from atrial fibrillation but is not on anticoagulation.  He is on dialysis Tuesday Thursday Saturday.  He is medically managed for hypertension.  He has a history of tobacco abuse.   PAST MEDICAL HISTORY          Past Medical History:  Diagnosis Date   Hypertension     Tobacco use          FAMILY HISTORY         Family History  Problem Relation Age of Onset   Hypertension Father        SOCIAL HISTORY:    Social History         Socioeconomic History   Marital status: Married      Spouse name: Not on file   Number of children: Not on file   Years of education: Not on file   Highest education level: Not on file  Occupational History   Not on file  Tobacco Use   Smoking status: Every Day      Packs/day: 0.25      Years: 40.00      Total pack years: 10.00      Types: Cigarettes   Smokeless tobacco: Never  Vaping Use   Vaping Use: Never used  Substance and Sexual Activity   Alcohol use: Yes      Comment: 2 shots per day   Drug use: Never   Sexual activity: Not on file  Other Topics Concern   Not on file  Social History Narrative   Not on file    Social Determinants of Health    Financial Resource Strain: Not on file  Food Insecurity: Not on file  Transportation Needs: Not on file  Physical Activity: Not on file  Stress: Not on file  Social  Connections: Not on file  Intimate Partner Violence: Not on file      ALLERGIES:      No Known Allergies   CURRENT MEDICATIONS:            Current Outpatient Medications  Medication Sig Dispense Refill   apixaban (ELIQUIS) 5 MG TABS tablet Take 1 tablet (5 mg total) by mouth 2 (two) times daily. 60 tablet 5   carvedilol (COREG) 25 MG tablet TAKE 1 TABLET BY MOUTH TWICE DAILY WITH A MEAL. Please make yearly appt with [name] for March 2022 for future refills. Thank you 1st attempt 180 tablet 0   diltiazem (CARDIZEM LA) 240 MG 24 hr tablet Take 1 tablet (240 mg total) by mouth daily. 90 tablet 3   furosemide (LASIX) 40 MG tablet Take 1 tablet by mouth twice daily 180 tablet 0   IRON PO Take 65 mg by mouth  2 (two) times daily.       Multiple Vitamin (MULTIVITAMIN WITH MINERALS) TABS tablet Take 1 tablet by mouth daily.       oxyCODONE (OXY IR/ROXICODONE) 5 MG immediate release tablet Take 0.5-1 tablets (2.5-5 mg total) by mouth every 6 (six) hours as needed for severe pain. 6 tablet 0   sodium bicarbonate 650 MG tablet Take 650 mg by mouth 2 (two) times daily.        No current facility-administered medications for this visit.      REVIEW OF SYSTEMS:    [X]  denotes positive finding, [ ]  denotes negative finding Cardiac   Comments:  Chest pain or chest pressure:      Shortness of breath upon exertion:      Short of breath when lying flat:      Irregular heart rhythm:             Vascular      Pain in calf, thigh, or hip brought on by ambulation:      Pain in feet at night that wakes you up from your sleep:       Blood clot in your veins:      Leg swelling:              Pulmonary      Oxygen at home:      Productive cough:       Wheezing:              Neurologic      Sudden weakness in arms or legs:       Sudden numbness in arms or legs:       Sudden onset of difficulty speaking or slurred speech:      Temporary loss of vision in one eye:       Problems with  dizziness:              Gastrointestinal      Blood in stool:         Vomited blood:              Genitourinary      Burning when urinating:       Blood in urine:             Psychiatric      Major depression:              Hematologic      Bleeding problems:      Problems with blood clotting too easily:             Skin      Rashes or ulcers:             Constitutional      Fever or chills:        PHYSICAL EXAM:       Vitals:    12/16/21 1441  BP: (!) 156/74  Pulse: 60  Resp: 20  Temp: 98.1 F (36.7 C)  SpO2: 99%  Weight: 131 lb (59.4 kg)  Height: 5\' 6"  (1.676 m)      GENERAL: The patient is a well-nourished male, in no acute distress. The vital signs are documented above. CARDIAC: There is a regular rate and rhythm.  VASCULAR: Left radial pulse PULMONARY: Nonlabored respirations ABDOMEN: Soft and non-tender with normal pitched bowel sounds.  MUSCULOSKELETAL: There are no major deformities or cyanosis. NEUROLOGIC: No focal weakness or paresthesias are detected. SKIN: There are no ulcers or rashes noted. PSYCHIATRIC:  The patient has a normal affect.   STUDIES:    I have reviewed the following: +-----------------+-------------+----------+--------+  Right Cephalic   Diameter (cm)Depth (cm)Findings  +-----------------+-------------+----------+--------+  Shoulder             0.16                         +-----------------+-------------+----------+--------+  Prox upper arm       0.22                         +-----------------+-------------+----------+--------+  Mid upper arm        0.24                         +-----------------+-------------+----------+--------+  Dist upper arm       0.24               lateral   +-----------------+-------------+----------+--------+  Antecubital fossa    0.20                         +-----------------+-------------+----------+--------+  Prox forearm         0.22                          +-----------------+-------------+----------+--------+  Mid forearm          0.18                         +-----------------+-------------+----------+--------+   +-----------------+-------------+----------+--------+  Right Basilic    Diameter (cm)Depth (cm)Findings  +-----------------+-------------+----------+--------+  Prox upper arm       0.20                         +-----------------+-------------+----------+--------+  Mid upper arm        0.21                         +-----------------+-------------+----------+--------+  Dist upper arm       0.18                         +-----------------+-------------+----------+--------+  Antecubital fossa    0.18                         +-----------------+-------------+----------+--------+   +-----------------+-------------+----------+--------+  Left Cephalic    Diameter (cm)Depth (cm)Findings  +-----------------+-------------+----------+--------+  Shoulder             0.22                         +-----------------+-------------+----------+--------+  Prox upper arm       0.20                         +-----------------+-------------+----------+--------+  Mid upper arm        0.24                         +-----------------+-------------+----------+--------+  Dist upper arm       0.21                         +-----------------+-------------+----------+--------+  Antecubital fossa  0.38                         +-----------------+-------------+----------+--------+  Prox forearm         0.22                         +-----------------+-------------+----------+--------+  Mid forearm          0.22                         +-----------------+-------------+----------+--------+  Dist forearm         0.09                         +-----------------+-------------+----------+--------+   +-----------------+-------------+----------+--------+  Left Basilic     Diameter  (cm)Depth (cm)Findings  +-----------------+-------------+----------+--------+  Mid upper arm        0.22                         +-----------------+-------------+----------+--------+  Dist upper arm       0.27                         +-----------------+-------------+----------+--------+  Antecubital fossa    0.17                         +-----------------+-------------+----------+--------+    ASSESSMENT and PLAN    End-stage renal disease: I discussed with the patient that we need to place access in his left arm.  Based off of his vein mapping, this will likely be a left upper arm Gore-Tex graft however I will look at the veins in his arm and create a fistula if that is an option.  I talked about the risk of not maturity as well as the risk of steal syndrome.  All his questions were answered.  We will get him scheduled in the near future on a nondialysis day.     Leia Alf, MD, FACS [facility] [phone] Pager [phone]

## 2022-01-17 NOTE — Anesthesia Procedure Notes (Signed)
Anesthesia Regional Block: Supraclavicular block   Pre-Anesthetic Checklist: , timeout performed,  Correct Patient, Correct Site, Correct Laterality,  Correct Procedure, Correct Position, site marked,  Risks and benefits discussed,  Surgical consent,  Pre-op evaluation,  At surgeon's request and post-op pain management  Laterality: Left  Prep: Dura Prep       Needles:  Injection technique: Single-shot  Needle Type: Echogenic Stimulator Needle     Needle Length: 5cm  Needle Gauge: 20     Additional Needles:   Procedures:,,,, ultrasound used (permanent image in chart),,    Narrative:  Start time: 01/17/2022 7:04 AM End time: 01/17/2022 7:08 AM Injection made incrementally with aspirations every 5 mL.  Performed by: Personally  Anesthesiologist: Darral Dash, DO  Additional Notes: Patient identified. Risks/Benefits/Options discussed with patient including but not limited to bleeding, infection, nerve damage, failed block, incomplete pain control. Patient expressed understanding and wished to proceed. All questions were answered. Sterile technique was used throughout the entire procedure. Please see nursing notes for vital signs. Aspirated in 5cc intervals with injection for negative confirmation. Patient was given instructions on fall risk and not to get out of bed. All questions and concerns addressed with instructions to call with any issues or inadequate analgesia.

## 2022-01-17 NOTE — Transfer of Care (Signed)
Immediate Anesthesia Transfer of Care Note  Patient: David Leblanc  Procedure(s) Performed: LEFT ARTERIOVENOUS (AV) FISTULA CREATION (Left: Arm Upper)  Patient Location: PACU  Anesthesia Type:MAC combined with regional for post-op pain  Level of Consciousness: drowsy  Airway & Oxygen Therapy: Patient Spontanous Breathing and Patient connected to face mask oxygen  Post-op Assessment: Report given to RN and Post -op Vital signs reviewed and stable  Post vital signs: Reviewed and stable  Last Vitals:  Vitals Value Taken Time  BP 124/74 01/17/22 0848  Temp    Pulse 72 01/17/22 0850  Resp 15 01/17/22 0850  SpO2 100 % 01/17/22 0850  Vitals shown include unvalidated device data.  Last Pain:  Vitals:   01/17/22 0625  TempSrc:   PainSc: 0-No pain      Patients Stated Pain Goal: 3 (73/53/29 9242)  Complications: No notable events documented.

## 2022-01-17 NOTE — Anesthesia Preprocedure Evaluation (Addendum)
Anesthesia Evaluation  Patient identified by MRN, date of birth, ID band Patient awake    Reviewed: Allergy & Precautions, NPO status , Patient's Chart, lab work & pertinent test results  Airway Mallampati: II  TM Distance: >3 FB Neck ROM: Full    Dental  (+) Missing,    Pulmonary neg pulmonary ROS, former smoker,    Pulmonary exam normal        Cardiovascular hypertension, Pt. on medications and Pt. on home beta blockers  Rhythm:Regular Rate:Normal     Neuro/Psych negative neurological ROS  negative psych ROS   GI/Hepatic negative GI ROS, Neg liver ROS,   Endo/Other  negative endocrine ROS  Renal/GU ESRF and DialysisRenal disease  negative genitourinary   Musculoskeletal negative musculoskeletal ROS (+)   Abdominal Normal abdominal exam  (+)   Peds  Hematology  (+) Blood dyscrasia, anemia ,   Anesthesia Other Findings   Reproductive/Obstetrics                            Anesthesia Physical Anesthesia Plan  ASA: 3  Anesthesia Plan: MAC and Regional   Post-op Pain Management: Regional block*   Induction: Intravenous  PONV Risk Score and Plan: 1 and Ondansetron, Dexamethasone, Propofol infusion and Treatment may vary due to age or medical condition  Airway Management Planned: Simple Face Mask, Natural Airway and Nasal Cannula  Additional Equipment: None  Intra-op Plan:   Post-operative Plan:   Informed Consent: I have reviewed the patients History and Physical, chart, labs and discussed the procedure including the risks, benefits and alternatives for the proposed anesthesia with the patient or authorized representative who has indicated his/her understanding and acceptance.     Dental advisory given  Plan Discussed with:   Anesthesia Plan Comments:         Anesthesia Quick Evaluation

## 2022-01-17 NOTE — Discharge Instructions (Signed)
° °  [facility] of [place] ° °Discharge Instructions ° °AV Fistula or Graft Surgery for Dialysis Access ° °Please refer to the following instructions for your post-procedure care. Your surgeon or physician assistant will discuss any changes with you. ° °Activity ° °You may drive the day following your surgery, if you are comfortable and no longer taking prescription pain medication. Resume full activity as the soreness in your incision resolves. ° °Bathing/Showering ° °You may shower after you go home. Keep your incision dry for 48 hours. Do not soak in a bathtub, hot tub, or swim until the incision heals completely. You may not shower if you have a hemodialysis catheter. ° °Incision Care ° °Clean your incision with mild soap and water after 48 hours. Pat the area dry with a clean towel. You do not need a bandage unless otherwise instructed. Do not apply any ointments or creams to your incision. You may have skin glue on your incision. Do not peel it off. It will come off on its own in about one week. Your arm may swell a bit after surgery. To reduce swelling use pillows to elevate your arm so it is above your heart. Your doctor will tell you if you need to lightly wrap your arm with an ACE bandage. ° °Diet ° °Resume your normal diet. There are not special food restrictions following this procedure. In order to heal from your surgery, it is CRITICAL to get adequate nutrition. Your body requires vitamins, minerals, and protein. Vegetables are the best source of vitamins and minerals. Vegetables also provide the perfect balance of protein. Processed food has little nutritional value, so try to avoid this. ° °Medications ° °Resume taking all of your medications. If your incision is causing pain, you may take over-the counter pain relievers such as acetaminophen (Tylenol). If you were prescribed a stronger pain medication, please be aware these medications can cause nausea and constipation. Prevent  nausea by taking the medication with a snack or meal. Avoid constipation by drinking plenty of fluids and eating foods with high amount of fiber, such as fruits, vegetables, and grains. Do not take Tylenol if you are taking prescription pain medications. ° ° ° ° °Follow up °Your surgeon may want to see you in the office following your access surgery. If so, this will be arranged at the time of your surgery. ° °Please call us immediately for any of the following conditions: ° °Increased pain, redness, drainage (pus) from your incision site °Fever of 101 degrees or higher °Severe or worsening pain at your incision site °Hand pain or numbness. ° °Reduce your risk of vascular disease: ° °Stop smoking. If you would like help, call [facility] at 1-800-QUIT-NOW (1-800-784-8669) or [facility] at 336-586-4000 ° °Manage your cholesterol °Maintain a desired weight °Control your diabetes °Keep your blood pressure down ° °Dialysis ° °It will take several weeks to several months for your new dialysis access to be ready for use. Your surgeon will determine when it is OK to use it. Your nephrologist will continue to direct your dialysis. You can continue to use your Permcath until your new access is ready for use. ° °If you have any questions, please call the office at [phone]. ° °

## 2022-01-17 NOTE — Anesthesia Postprocedure Evaluation (Signed)
Anesthesia Post Note  Patient: David Leblanc  Procedure(s) Performed: LEFT ARTERIOVENOUS (AV) FISTULA CREATION (Left: Arm Upper)     Patient location during evaluation: PACU Anesthesia Type: Regional and MAC Level of consciousness: awake and alert Pain management: pain level controlled Vital Signs Assessment: post-procedure vital signs reviewed and stable Respiratory status: spontaneous breathing, nonlabored ventilation, respiratory function stable and patient connected to nasal cannula oxygen Cardiovascular status: stable and blood pressure returned to baseline Postop Assessment: no apparent nausea or vomiting Anesthetic complications: no   No notable events documented.  Last Vitals:  Vitals:   01/17/22 0945 01/17/22 0956  BP: 103/66 105/70  Pulse: 72 71  Resp: 15 15  Temp:  36.5 C  SpO2: 94% 95%    Last Pain:  Vitals:   01/17/22 0956  TempSrc:   PainSc: 0-No pain                 Belenda Cruise P Adreanna Fickel

## 2022-01-17 NOTE — Anesthesia Procedure Notes (Signed)
Procedure Name: MAC Date/Time: 01/17/2022 7:31 AM  Performed by: Colin Benton, CRNAPre-anesthesia Checklist: Patient identified, Emergency Drugs available, Suction available and Patient being monitored Patient Re-evaluated:Patient Re-evaluated prior to induction Oxygen Delivery Method: Simple face mask Induction Type: IV induction Placement Confirmation: positive ETCO2 Dental Injury: Teeth and Oropharynx as per pre-operative assessment

## 2022-01-18 ENCOUNTER — Encounter (HOSPITAL_COMMUNITY): Payer: Self-pay | Admitting: Surgery

## 2022-01-19 NOTE — Op Note (Signed)
    Patient name: David Leblanc MRN: [id number] DOB: 11/29/1950 Sex: male  01/17/2022 Pre-operative Diagnosis: ESRD Post-operative diagnosis:  Same Surgeon:  Annamarie Major Assistants:  Arlee Muslim Procedure:   First stage left basilic vein fistula Anesthesia: MAC Blood Loss: Minimal Specimens: None  Findings: Healthy 4 mm artery, 3 to 4 mm vein  Indications: This is a 71 year old gentleman with end-stage renal disease who comes in today for fistula versus graft.  Procedure:  The patient was identified in the holding area and taken to [place] [facility]  The patient was then placed supine on the table. MAC anesthesia was administered.  The patient was prepped and draped in the usual sterile fashion.  A time out was called and antibiotics were administered.  A PA was necessary to expedite the procedure and assist with technical details.  The PA provided suction and retraction for exposure as well as following the suture and wound closure.  Ultrasound was used to evaluate the surface veins in the upper arm.  The basilic vein appeared to be at least 3 mm. A longitudinal incision was made in the left upper arm just above the antecubital crease.  I first dissected out the brachial artery which was a diseased 3 4 mm artery.  I then exposed the basilic vein.  This vein measured about 3.5 mm.  There were multiple side branches that required division.  Ultimately, the vein was marked for orientation and ligated distally.  It distended nicely with heparin saline.  Next, the brachial artery was occluded with vascular clamps and a #11 blade was used to make an arteriotomy.  This was extended longitudinally with Potts scissors.  The vein was then cut to the appropriate length and spatulated for the size the arteriotomy.  A running anastomosis was then created with 6-0 Prolene.  Prior to completion the appropriate flushing maneuvers were performed and the anastomosis was completed.  The patient had brisk radial and  ulnar Doppler signals as well as a good thrill within the fistula.  I inspected the course of the vein to make sure there were no kinks.  The wound was then irrigated.  Hemostasis was achieved.  The deep tissue was reapproximated with 3-0 Vicryl.  Subcuticular sutures were then placed followed by Dermabond.  There were no immediate complications.   Disposition: To PACU stable.   Theotis Burrow, M.D., [facility] [place] Office: [phone] Pager:  [phone]

## 2022-02-05 ENCOUNTER — Telehealth: Payer: Self-pay

## 2022-02-05 NOTE — Telephone Encounter (Signed)
Pt called with c/o L arm swelling and pain x 3-4 days. He was at [facility] today and they told him to call us for an appt. Pt has been scheduled with APP tomorrow.

## 2022-02-06 ENCOUNTER — Other Ambulatory Visit: Payer: Self-pay | Admitting: *Deleted

## 2022-02-06 ENCOUNTER — Ambulatory Visit (INDEPENDENT_AMBULATORY_CARE_PROVIDER_SITE_OTHER): Payer: Medicare Other | Admitting: Physician Assistant

## 2022-02-06 VITALS — BP 135/62 | HR 73 | Temp 97.6°F | Resp 20 | Ht 66.0 in | Wt 137.1 lb

## 2022-02-06 DIAGNOSIS — Z992 Dependence on renal dialysis: Secondary | ICD-10-CM

## 2022-02-06 DIAGNOSIS — N186 End stage renal disease: Secondary | ICD-10-CM

## 2022-02-06 NOTE — Progress Notes (Signed)
  POST OPERATIVE OFFICE NOTE    CC:  F/u for surgery  HPI:  This is a 71 y.o. male who is s/p left 1st stage BVT on 01/17/2022 by Dr. Trula Slade.   Pt states he does not have pain/numbness in the left hand.   He did start having some swelling in the left arm about a week ago.  He states the swelling is better today.  His family member states that it did get some better after lasix as well.    Pt states that he did "break a ligament" in the left forearm several months ago but could not undergo surgery for it and was told it would have to heal on its own.  He did not have any swelling with his injury.   The pt is on dialysis M/W/F.   No Known Allergies  Current Outpatient Medications  Medication Sig Dispense Refill   carvedilol (COREG) 25 MG tablet Take 25 mg by mouth daily.     DILT-XR 240 MG 24 hr capsule Take 240 mg by mouth daily.     furosemide (LASIX) 40 MG tablet Take 1 tablet by mouth twice daily (Patient taking differently: Take 40 mg by mouth See admin instructions. Take 1 tablet (40 mg) by mouth twice daily on Sundays, Mondays, Wednesdays & Fridays ONLY.) 180 tablet 0   HYDROcodone-acetaminophen (NORCO) 5-325 MG tablet Take 1 tablet by mouth every 6 (six) hours as needed for moderate pain. 10 tablet 0   Multiple Vitamin (MULTIVITAMIN WITH MINERALS) TABS tablet Take 1 tablet by mouth in the morning.     sevelamer carbonate (RENVELA) 800 MG tablet Take 800 mg by mouth 3 (three) times daily with meals.     No current facility-administered medications for this visit.     ROS:  See HPI  Physical Exam:  Today's Vitals   02/06/22 1001  BP: 135/62  Pulse: 73  Resp: 20  Temp: 97.6 F (36.4 C)  TempSrc: Temporal  SpO2: 97%  Weight: 137 lb 1.6 oz (62.2 kg)  Height: 5\' 6"  (1.676 m)  PainSc: 5   PainLoc: Arm   Body mass index is 22.13 kg/m.   Incision:  well healed Extremities:   There is a palpable left radial  pulse.   He does have swelling in the left arm from hand to  above the elbow.  His skin is soft and not tense.   Motor and sensory are in tact.   There is a thrill/bruit present.  Access is  easily palpable    Assessment/Plan:  This is a 71 y.o. male who is s/p: Left 1st stage BVT 01/17/2022 by Dr. Trula Slade  -the pt does not have evidence of steal. -pt presents today with swelling of left arm that has been present for about a week.  It is better today.  Discussed with Dr. Donzetta Matters and given pt has TDC in the left IJ, he most likely has some outflow issues due to the catheter.  I discussed this with the pt and his family member.  Advised him to elevate his arm as tolerated.  Also, if swelling worsens, we can bring him back for DVT study but given TDC and outflow, feel swelling is most likely due to this.  -the pt will follow up with his scheduled appt in a couple of weeks.  They will call sooner if he has any issues before then.   Leontine Locket, [facility] [phone]  Clinic MD:  Donzetta Matters

## 2022-02-24 ENCOUNTER — Ambulatory Visit (HOSPITAL_COMMUNITY)
Admission: RE | Admit: 2022-02-24 | Discharge: 2022-02-24 | Disposition: A | Discharge status: Home / Self Care | Payer: Medicare Other | Source: Ambulatory Visit | Attending: Surgery | Admitting: Surgery

## 2022-02-24 ENCOUNTER — Ambulatory Visit (INDEPENDENT_AMBULATORY_CARE_PROVIDER_SITE_OTHER): Payer: Medicare Other | Admitting: Physician Assistant

## 2022-02-24 VITALS — BP 121/64 | HR 75 | Temp 97.7°F | Resp 20 | Ht 66.0 in | Wt 135.4 lb

## 2022-02-24 DIAGNOSIS — Z992 Dependence on renal dialysis: Secondary | ICD-10-CM

## 2022-02-24 DIAGNOSIS — N186 End stage renal disease: Secondary | ICD-10-CM | POA: Diagnosis present

## 2022-02-24 NOTE — Progress Notes (Signed)
Postoperative Access Visit   History of Present Illness   David Leblanc is a 71 y.o. 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 Trula Slade on 01/17/22. Patient's wounds are well healed.  The patient notes no steal symptoms. Patient however reports that back in May he had fallen and broken his left arm but due to his renal function they did not want to do surgery to reset his arm so they told him it would heal on its own. It still gives him discomfort and limits his mobility in his left arm. He says he is unable to lift any more than 2-3 lbs. He has been wearing arm sling while out of the house but otherwise usually keeps arm propped up on pillow while at home.  He currently dialyzes via Left IJ TDC at the [facility] location on TTS  Physical Examination   Vitals:   02/24/22 1041  BP: 121/64  Pulse: 75  Resp: 20  Temp: 97.7 F (36.5 C)  TempSrc: Temporal  SpO2: 98%  Weight: 135 lb 6.4 oz (61.4 kg)  Height: 5\' 6"  (1.676 m)   Body mass index is 21.85 kg/m.  left arm Incision is well healed, 2+ radial pulse, hand grip is 3/5, sensation in digits is intact, palpable thrill, bruit can be auscultated    Non invasive vascular lab: Findings:  +--------------------+----------+-----------------+--------+  AVF                PSV (cm/s)Flow Vol (mL/min)Comments  +--------------------+----------+-----------------+--------+  Native artery inflow   204          1012                 +--------------------+----------+-----------------+--------+  AVF Anastomosis        336                               +--------------------+----------+-----------------+--------+    +------------+----------+-------------+----------+----------------+  OUTFLOW VEINPSV (cm/s)Diameter (cm)Depth (cm)    Describe      +------------+----------+-------------+----------+----------------+  Prox UA        194        0.55        1.23                      +------------+----------+-------------+----------+----------------+  Mid UA         241        0.37        0.77   competing branch  +------------+----------+-------------+----------+----------------+  Dist UA        414        0.36        1.33                     +------------+----------+-------------+----------+----------------+  AC Fossa       336        0.37        1.27                     +------------+----------+-------------+----------+----------------+    Summary:  Patent arteriovenous fistula with two competing branches observed in the mid upper arm.    Medical Decision Making   David Leblanc is a 71 y.o. year old male who presents s/p left 1st stage basilic vein fistula by Dr. Trula Slade on 01/17/22. Fistula is patent with good volume flow and a good  thrill. Duplex shows that fistula is deep in the left upper arm and has not matured.  Patent is without signs or symptoms of steal syndrome Patient has limitations in left arm mobility secondary to injury so he has not really been using or exercising the arm. Patient was given stress ball today and I encouraged him to use this daily to help with fistula maturation He will return in 8 weeks for re peat duplex to see if fistula has matured more   Karoline Caldwell, PA-C [facility] [place] Office: [facility] MD: Trula Slade

## 2022-02-27 ENCOUNTER — Other Ambulatory Visit: Payer: Self-pay

## 2022-02-27 DIAGNOSIS — N186 End stage renal disease: Secondary | ICD-10-CM

## 2022-04-28 ENCOUNTER — Ambulatory Visit (HOSPITAL_COMMUNITY)
Admission: RE | Admit: 2022-04-28 | Discharge: 2022-04-28 | Disposition: A | Discharge status: Home / Self Care | Payer: Medicare Other | Source: Ambulatory Visit | Attending: Surgery | Admitting: Surgery

## 2022-04-28 ENCOUNTER — Other Ambulatory Visit: Payer: Self-pay

## 2022-04-28 ENCOUNTER — Ambulatory Visit (INDEPENDENT_AMBULATORY_CARE_PROVIDER_SITE_OTHER): Payer: Medicare Other | Admitting: Physician Assistant

## 2022-04-28 VITALS — BP 136/68 | HR 65 | Temp 97.6°F | Resp 20 | Ht 66.0 in | Wt 142.2 lb

## 2022-04-28 DIAGNOSIS — Z992 Dependence on renal dialysis: Secondary | ICD-10-CM

## 2022-04-28 DIAGNOSIS — N186 End stage renal disease: Secondary | ICD-10-CM

## 2022-04-28 MED ORDER — SODIUM CHLORIDE 0.9 % IV SOLN: 250.0000 mL | INTRAVENOUS | Status: AC | PRN

## 2022-04-28 MED ORDER — SODIUM CHLORIDE 0.9% FLUSH: 3.0000 mL | Freq: Two times a day (BID) | INTRAVENOUS | Status: AC

## 2022-04-28 NOTE — Progress Notes (Signed)
[facility] [place] HISTORY AND PHYSICAL   History of Present Illness:  Patient is a 72 y.o. year old male who presents for follow up exam.  S/P left first stage basilic fistula.  Back in May 2023 he had fallen and broken his left arm but due to his renal function they did not want to do surgery to reset his arm so they told him it would heal on its own. Ortho is following fracture healing.  He has not been seen for a while by Orthopedics and is still having pain.    He was last seen in our office 02/24/22.  The fistula was not maturing as well as we hoped at the time.  He was given exercises and asked to f/u with duplex today.  He denies hand and finger pain, or loss of motor and sensation in the left UE due to decreased blood flow.    Past Medical History:  Diagnosis Date   Anemia    Chronic kidney disease    T-TH-S   ESRD (end stage renal disease) ([place])    Hypertension    Tobacco use     Past Surgical History:  Procedure Laterality Date   AV FISTULA PLACEMENT Left 01/17/2022   Procedure: LEFT ARTERIOVENOUS (AV) FISTULA CREATION;  Surgeon: Serafina Mitchell, MD;  Location: [facility] OR;  Service: Vascular;  Laterality: Left;   NO PAST SURGERIES       Social History Social History   Tobacco Use   Smoking status: Former    Packs/day: 0.25    Years: 40.00    Total pack years: 10.00    Types: Cigarettes    Quit date: 09/02/2021    Years since quitting: 0.6    Passive exposure: Never   Smokeless tobacco: Never  Vaping Use   Vaping Use: Never used  Substance Use Topics   Alcohol use: Not Currently    Comment: 2 shots per day   Drug use: Never    Family History Family History  Problem Relation Age of Onset   Hypertension Father     Allergies  No Known Allergies   Current Outpatient Medications  Medication Sig Dispense Refill   carvedilol (COREG) 3.125 MG tablet Take by mouth.     DILT-XR 240 MG 24 hr capsule Take 240 mg by mouth daily.      furosemide (LASIX) 40 MG tablet Take 1 tablet by mouth twice daily (Patient taking differently: Take 40 mg by mouth See admin instructions. Take 1 tablet (40 mg) by mouth twice daily on Sundays, Mondays, Wednesdays & Fridays ONLY.) 180 tablet 0   HYDROcodone-acetaminophen (NORCO) 5-325 MG tablet Take 1 tablet by mouth every 6 (six) hours as needed for moderate pain. 10 tablet 0   iron sucrose in sodium chloride 0.9 % 100 mL Iron Sucrose (Venofer)     Methoxy PEG-Epoetin Beta (MIRCERA IJ) Mircera     Multiple Vitamin (MULTIVITAMIN WITH MINERALS) TABS tablet Take 1 tablet by mouth in the morning.     sevelamer carbonate (RENVELA) 800 MG tablet Take 800 mg by mouth 3 (three) times daily with meals.     traZODone (DESYREL) 100 MG tablet Take 100 mg by mouth at bedtime.     No current facility-administered medications for this visit.    ROS:   General:  No weight loss, Fever, chills  HEENT: No recent headaches, no nasal bleeding, no visual changes, no sore throat  Neurologic: No dizziness, blackouts, seizures. No recent symptoms  of stroke or mini- stroke. No recent episodes of slurred speech, or temporary blindness.  Cardiac: No recent episodes of chest pain/pressure, no shortness of breath at rest.  No shortness of breath with exertion.  Denies history of atrial fibrillation or irregular heartbeat  Vascular: No history of rest pain in feet.  No history of claudication.  No history of non-healing ulcer, No history of DVT   Pulmonary: No home oxygen, no productive cough, no hemoptysis,  No asthma or wheezing  Musculoskeletal:  [ ]  Arthritis, [ ]  Low back pain,  [x ] Joint pain  Hematologic:No history of hypercoagulable state.  No history of easy bleeding.  No history of anemia  Gastrointestinal: No hematochezia or melena,  No gastroesophageal reflux, no trouble swallowing  Urinary: [ ]  chronic Kidney disease, [ x] on HD - [ ]  MWF or [x ] TTHS, [ ]  Burning with urination, [ ]  Frequent  urination, [ ]  Difficulty urinating;   Skin: No rashes  Psychological: No history of anxiety,  No history of depression   Physical Examination  Vitals:   04/28/22 0821  BP: 136/68  Pulse: 65  Resp: 20  Temp: 97.6 F (36.4 C)  TempSrc: Temporal  SpO2: 98%  Weight: 142 lb 3.2 oz (64.5 kg)  Height: 5\' 6"  (1.676 m)    Body mass index is 22.95 kg/m.  General:  Alert and oriented, no acute distress HEENT: Normal Neck: No bruit or JVD Pulmonary: Clear to auscultation bilaterally Cardiac: Regular Rate and Rhythm without murmur Gastrointestinal: Soft, non-tender, non-distended, no mass, no scars Skin: No rash Extremity Pulses:  left radial pulse, left UE anastomosis thrill palpable Musculoskeletal: No deformity or edema  Neurologic: Upper and lower extremity motor 5/5 and symmetric  DATA:  Findings:  +--------------------+----------+-----------------+--------+  AVF                PSV (cm/s)Flow Vol (mL/min)Comments  +--------------------+----------+-----------------+--------+  Native artery inflow   146           400                 +--------------------+----------+-----------------+--------+  AVF Anastomosis        169                               +--------------------+----------+-----------------+--------+     +------------+----------+-------------+----------+--------+  OUTFLOW VEINPSV (cm/s)Diameter (cm)Depth (cm)Describe  +------------+----------+-------------+----------+--------+  Prox UA         9         0.37        1.50             +------------+----------+-------------+----------+--------+  Mid UA          55        0.31        1.78             +------------+----------+-------------+----------+--------+  Dist UA        365        0.55        0.85             +------------+----------+-------------+----------+--------+        Summary:  Arteriovenous fistula-Stenosis 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 Significantly diminished flow noted in outflow vein, distal  to the  stenosis.   ASSESSMENT/PLAN: ESRD on HD via TDC on the right IJ The basilic fistula shows poor flow rates and diameter of  0.3.  He will be scheduled for a fistulogram with possible intervention to improve the flow rates and help with maturity of the fistula before we proceed with second stage basilic transposition.    The malunion may possible be re addressed of the olecranon fracture now that he is ESRD on HD.  He will reach out for a f/u with Dr. Marlou Sa and discuss the possibility of fixation to repair the fracture.  He states he still has poor function in the left UE due to pain at the elbow.       Roxy Horseman PA-C [facility] [place] Office: [phone]  MD in clinic [place]

## 2022-04-28 NOTE — H&P (View-Only) (Signed)
[facility] [place] HISTORY AND PHYSICAL   History of Present Illness:  Patient is a 72 y.o. year old male who presents for follow up exam.  S/P left first stage basilic fistula.  Back in May 2023 he had fallen and broken his left arm but due to his renal function they did not want to do surgery to reset his arm so they told him it would heal on its own. Ortho is following fracture healing.  He has not been seen for a while by Orthopedics and is still having pain.    He was last seen in our office 02/24/22.  The fistula was not maturing as well as we hoped at the time.  He was given exercises and asked to f/u with duplex today.  He denies hand and finger pain, or loss of motor and sensation in the left UE due to decreased blood flow.    Past Medical History:  Diagnosis Date   Anemia    Chronic kidney disease    T-TH-S   ESRD (end stage renal disease) ([place])    Hypertension    Tobacco use     Past Surgical History:  Procedure Laterality Date   AV FISTULA PLACEMENT Left 01/17/2022   Procedure: LEFT ARTERIOVENOUS (AV) FISTULA CREATION;  Surgeon: Serafina Mitchell, MD;  Location: [facility] OR;  Service: Vascular;  Laterality: Left;   NO PAST SURGERIES       Social History Social History   Tobacco Use   Smoking status: Former    Packs/day: 0.25    Years: 40.00    Total pack years: 10.00    Types: Cigarettes    Quit date: 09/02/2021    Years since quitting: 0.6    Passive exposure: Never   Smokeless tobacco: Never  Vaping Use   Vaping Use: Never used  Substance Use Topics   Alcohol use: Not Currently    Comment: 2 shots per day   Drug use: Never    Family History Family History  Problem Relation Age of Onset   Hypertension Father     Allergies  No Known Allergies   Current Outpatient Medications  Medication Sig Dispense Refill   carvedilol (COREG) 3.125 MG tablet Take by mouth.     DILT-XR 240 MG 24 hr capsule Take 240 mg by mouth daily.      furosemide (LASIX) 40 MG tablet Take 1 tablet by mouth twice daily (Patient taking differently: Take 40 mg by mouth See admin instructions. Take 1 tablet (40 mg) by mouth twice daily on Sundays, Mondays, Wednesdays & Fridays ONLY.) 180 tablet 0   HYDROcodone-acetaminophen (NORCO) 5-325 MG tablet Take 1 tablet by mouth every 6 (six) hours as needed for moderate pain. 10 tablet 0   iron sucrose in sodium chloride 0.9 % 100 mL Iron Sucrose (Venofer)     Methoxy PEG-Epoetin Beta (MIRCERA IJ) Mircera     Multiple Vitamin (MULTIVITAMIN WITH MINERALS) TABS tablet Take 1 tablet by mouth in the morning.     sevelamer carbonate (RENVELA) 800 MG tablet Take 800 mg by mouth 3 (three) times daily with meals.     traZODone (DESYREL) 100 MG tablet Take 100 mg by mouth at bedtime.     No current facility-administered medications for this visit.    ROS:   General:  No weight loss, Fever, chills  HEENT: No recent headaches, no nasal bleeding, no visual changes, no sore throat  Neurologic: No dizziness, blackouts, seizures. No recent symptoms  of stroke or mini- stroke. No recent episodes of slurred speech, or temporary blindness.  Cardiac: No recent episodes of chest pain/pressure, no shortness of breath at rest.  No shortness of breath with exertion.  Denies history of atrial fibrillation or irregular heartbeat  Vascular: No history of rest pain in feet.  No history of claudication.  No history of non-healing ulcer, No history of DVT   Pulmonary: No home oxygen, no productive cough, no hemoptysis,  No asthma or wheezing  Musculoskeletal:  [ ]  Arthritis, [ ]  Low back pain,  [x ] Joint pain  Hematologic:No history of hypercoagulable state.  No history of easy bleeding.  No history of anemia  Gastrointestinal: No hematochezia or melena,  No gastroesophageal reflux, no trouble swallowing  Urinary: [ ]  chronic Kidney disease, [ x] on HD - [ ]  MWF or [x ] TTHS, [ ]  Burning with urination, [ ]  Frequent  urination, [ ]  Difficulty urinating;   Skin: No rashes  Psychological: No history of anxiety,  No history of depression   Physical Examination  Vitals:   04/28/22 0821  BP: 136/68  Pulse: 65  Resp: 20  Temp: 97.6 F (36.4 C)  TempSrc: Temporal  SpO2: 98%  Weight: 142 lb 3.2 oz (64.5 kg)  Height: 5\' 6"  (1.676 m)    Body mass index is 22.95 kg/m.  General:  Alert and oriented, no acute distress HEENT: Normal Neck: No bruit or JVD Pulmonary: Clear to auscultation bilaterally Cardiac: Regular Rate and Rhythm without murmur Gastrointestinal: Soft, non-tender, non-distended, no mass, no scars Skin: No rash Extremity Pulses:  left radial pulse, left UE anastomosis thrill palpable Musculoskeletal: No deformity or edema  Neurologic: Upper and lower extremity motor 5/5 and symmetric  DATA:  Findings:  +--------------------+----------+-----------------+--------+  AVF                PSV (cm/s)Flow Vol (mL/min)Comments  +--------------------+----------+-----------------+--------+  Native artery inflow   146           400                 +--------------------+----------+-----------------+--------+  AVF Anastomosis        169                               +--------------------+----------+-----------------+--------+     +------------+----------+-------------+----------+--------+  OUTFLOW VEINPSV (cm/s)Diameter (cm)Depth (cm)Describe  +------------+----------+-------------+----------+--------+  Prox UA         9         0.37        1.50             +------------+----------+-------------+----------+--------+  Mid UA          55        0.31        1.78             +------------+----------+-------------+----------+--------+  Dist UA        365        0.55        0.85             +------------+----------+-------------+----------+--------+        Summary:  Arteriovenous fistula-Stenosis 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 Significantly diminished flow noted in outflow vein, distal  to the  stenosis.   ASSESSMENT/PLAN: ESRD on HD via TDC on the right IJ The basilic fistula shows poor flow rates and diameter of  0.3.  He will be scheduled for a fistulogram with possible intervention to improve the flow rates and help with maturity of the fistula before we proceed with second stage basilic transposition.    The malunion may possible be re addressed of the olecranon fracture now that he is ESRD on HD.  He will reach out for a f/u with Dr. Marlou Sa and discuss the possibility of fixation to repair the fracture.  He states he still has poor function in the left UE due to pain at the elbow.       Roxy Horseman PA-C [facility] [place] Office: [phone]  MD in clinic [place]

## 2022-05-02 ENCOUNTER — Telehealth: Payer: Self-pay

## 2022-05-02 NOTE — Telephone Encounter (Signed)
Patient having an angiogram on the 16th and V&V asking them to call us and ask if there is something we need to do to that elbow before the angiogram and port for dialysis? [phone] Daughter-Anisha

## 2022-05-02 NOTE — Telephone Encounter (Signed)
No thx

## 2022-05-02 NOTE — Telephone Encounter (Signed)
IC advised.  

## 2022-05-06 ENCOUNTER — Encounter (HOSPITAL_COMMUNITY)
Admission: RE | Disposition: A | Discharge status: Home / Self Care | Payer: Self-pay | Source: Ambulatory Visit | Attending: Surgery

## 2022-05-06 ENCOUNTER — Ambulatory Visit (HOSPITAL_COMMUNITY)
Admission: RE | Admit: 2022-05-06 | Discharge: 2022-05-06 | Disposition: A | Discharge status: Home / Self Care | Payer: Medicare Other | Source: Ambulatory Visit | Attending: Surgery | Admitting: Surgery

## 2022-05-06 ENCOUNTER — Other Ambulatory Visit: Payer: Self-pay

## 2022-05-06 ENCOUNTER — Encounter (HOSPITAL_COMMUNITY): Payer: Self-pay | Admitting: Surgery

## 2022-05-06 DIAGNOSIS — Y832 Surgical operation with anastomosis, bypass or graft as the cause of abnormal reaction of the patient, or of later complication, without mention of misadventure at the time of the procedure: Secondary | ICD-10-CM | POA: Diagnosis not present

## 2022-05-06 DIAGNOSIS — M25522 Pain in left elbow: Secondary | ICD-10-CM | POA: Insufficient documentation

## 2022-05-06 DIAGNOSIS — I120 Hypertensive chronic kidney disease with stage 5 chronic kidney disease or end stage renal disease: Secondary | ICD-10-CM | POA: Diagnosis not present

## 2022-05-06 DIAGNOSIS — T82898A Other specified complication of vascular prosthetic devices, implants and grafts, initial encounter: Secondary | ICD-10-CM | POA: Diagnosis not present

## 2022-05-06 DIAGNOSIS — Z87891 Personal history of nicotine dependence: Secondary | ICD-10-CM | POA: Diagnosis not present

## 2022-05-06 DIAGNOSIS — N186 End stage renal disease: Secondary | ICD-10-CM | POA: Insufficient documentation

## 2022-05-06 DIAGNOSIS — Z992 Dependence on renal dialysis: Secondary | ICD-10-CM | POA: Insufficient documentation

## 2022-05-06 DIAGNOSIS — N185 Chronic kidney disease, stage 5: Secondary | ICD-10-CM

## 2022-05-06 HISTORY — PX: A/V FISTULAGRAM: CATH118298

## 2022-05-06 LAB — POCT I-STAT, CHEM 8
BUN: 24 mg/dL — ABNORMAL HIGH (ref 8–23)
Calcium, Ion: 1.07 mmol/L — ABNORMAL LOW (ref 1.15–1.40)
Chloride: 95 mmol/L — ABNORMAL LOW (ref 98–111)
Creatinine, Ser: 5.2 mg/dL — ABNORMAL HIGH (ref 0.61–1.24)
Glucose, Bld: 112 mg/dL — ABNORMAL HIGH (ref 70–99)
HCT: 41 % (ref 39.0–52.0)
Hemoglobin: 13.9 g/dL (ref 13.0–17.0)
Potassium: 5.4 mmol/L — ABNORMAL HIGH (ref 3.5–5.1)
Sodium: 136 mmol/L (ref 135–145)
TCO2: 30 mmol/L (ref 22–32)

## 2022-05-06 SURGERY — A/V FISTULAGRAM
Anesthesia: LOCAL | Laterality: Left

## 2022-05-06 MED ORDER — LIDOCAINE HCL (PF) 1 % IJ SOLN
INTRAMUSCULAR | Status: DC | PRN
  Administered 2022-05-06: 5 mL

## 2022-05-06 MED ORDER — FENTANYL CITRATE (PF) 100 MCG/2ML IJ SOLN
INTRAMUSCULAR | Status: DC | PRN
  Administered 2022-05-06: 25 ug via INTRAVENOUS

## 2022-05-06 MED ORDER — SODIUM CHLORIDE 0.9% FLUSH: 3.0000 mL | INTRAVENOUS | Status: DC | PRN

## 2022-05-06 MED ORDER — MIDAZOLAM HCL 2 MG/2ML IJ SOLN
INTRAMUSCULAR | Status: AC
  Filled 2022-05-06: qty 2

## 2022-05-06 MED ORDER — HEPARIN (PORCINE) IN NACL 1000-0.9 UT/500ML-% IV SOLN
INTRAVENOUS | Status: DC | PRN
  Administered 2022-05-06: 500 mL

## 2022-05-06 MED ORDER — IODIXANOL 320 MG/ML IV SOLN
INTRAVENOUS | Status: DC | PRN
  Administered 2022-05-06: 35 mL

## 2022-05-06 MED ORDER — FENTANYL CITRATE (PF) 100 MCG/2ML IJ SOLN
INTRAMUSCULAR | Status: AC
  Filled 2022-05-06: qty 2

## 2022-05-06 MED ORDER — MIDAZOLAM HCL 2 MG/2ML IJ SOLN
INTRAMUSCULAR | Status: DC | PRN
  Administered 2022-05-06: 1 mg via INTRAVENOUS

## 2022-05-06 MED ORDER — HEPARIN (PORCINE) IN NACL 1000-0.9 UT/500ML-% IV SOLN
INTRAVENOUS | Status: AC
  Filled 2022-05-06: qty 1000

## 2022-05-06 MED ORDER — LIDOCAINE HCL (PF) 1 % IJ SOLN
INTRAMUSCULAR | Status: AC
  Filled 2022-05-06: qty 30

## 2022-05-06 SURGICAL SUPPLY — 11 items
BAG SNAP BAND KOVER 36X36 (MISCELLANEOUS) ×1 IMPLANT
COVER DOME SNAP 22 D (MISCELLANEOUS) ×1 IMPLANT
GLIDEWIRE NITREX 0.018X80X5 (WIRE) ×1
GUIDEWIRE NITREX 0.018X80X5 (WIRE) IMPLANT
KIT MICROPUNCTURE NIT STIFF (SHEATH) IMPLANT
PROTECTION STATION PRESSURIZED (MISCELLANEOUS) ×2
SHEATH PROBE COVER 6X72 (BAG) ×1 IMPLANT
STATION PROTECTION PRESSURIZED (MISCELLANEOUS) ×1 IMPLANT
STOPCOCK MORSE 400PSI 3WAY (MISCELLANEOUS) ×1 IMPLANT
TRAY PV CATH (CUSTOM PROCEDURE TRAY) ×1 IMPLANT
TUBING CIL FLEX 10 FLL-RA (TUBING) ×1 IMPLANT

## 2022-05-06 NOTE — Interval H&P Note (Signed)
History and Physical Interval Note:  05/06/2022 9:15 AM  David Leblanc  has presented today for surgery, with the diagnosis of instage renal.  The various methods of treatment have been discussed with the patient and family. After consideration of risks, benefits and other options for treatment, the patient has consented to  Procedure(s): A/V Fistulagram (Left) as a surgical intervention.  The patient's history has been reviewed, patient examined, no change in status, stable for surgery.  I have reviewed the patient's chart and labs.  Questions were answered to the patient's satisfaction.     Annamarie Major

## 2022-05-06 NOTE — Op Note (Signed)
    Patient name: David Leblanc MRN: [id number] DOB: 09/07/1950 Sex: male  05/06/2022 Pre-operative Diagnosis: Not maturing fistula Post-operative diagnosis:  Same Surgeon:  Annamarie Major Procedure Performed:  1.  Ultrasound-guided access, left basilic vein  2.  Fistulogram  3.  Conscious sedation, 26 minutes    Indications: This is a 72 year old gentleman with renal failure who had a for stage basilic vein fistula placed that is not maturing.  He comes in today for further evaluation  Procedure:  The patient was identified in the holding area and taken to room 8.  The patient was then placed supine on the table and prepped and draped in the usual sterile fashion.  A time out was called.  Conscious sedation was administered with the use of IV fentanyl and Versed under continuous physician and nurse monitoring.  Heart rate, blood pressure, and oxygen saturations were continuously monitored.  Total sedation time was 26 minutes ultrasound was used to evaluate the fistula.  The vein was patent and compressible.  A digital ultrasound image was acquired.  The fistula was then accessed under ultrasound guidance using a micropuncture needle.  An 018 wire was then asvanced without resistance and a micropuncture sheath was placed.  Contrast injections were then performed through the sheath.  Findings: The central venous system is occluded.  This is likely secondary to the existing catheter.  The basilic vein in the arm does not appear to be suitable for fistula   Impression:  #1  Left-sided central vein occlusion, from existing tunneled catheter  #2  Small caliber basilic vein fistula  #3  Recommend new access in the right arm    V. Annamarie Major, M.D., [facility] [place] Office: [phone] Pager:  [phone]

## 2022-05-06 NOTE — Progress Notes (Signed)
Patient and wife was given discharge instructions. Both verbalized understanding. 

## 2022-05-07 ENCOUNTER — Other Ambulatory Visit: Payer: Self-pay

## 2022-05-07 DIAGNOSIS — N186 End stage renal disease: Secondary | ICD-10-CM

## 2022-05-22 ENCOUNTER — Encounter (HOSPITAL_COMMUNITY): Payer: Self-pay | Admitting: Surgery

## 2022-05-22 ENCOUNTER — Other Ambulatory Visit: Payer: Self-pay

## 2022-05-22 NOTE — Anesthesia Preprocedure Evaluation (Addendum)
Anesthesia Evaluation  Patient identified by MRN, date of birth, ID band Patient awake    Reviewed: Allergy & Precautions, NPO status , Patient's Chart, lab work & pertinent test results  Airway Mallampati: II  TM Distance: >3 FB Neck ROM: Full    Dental no notable dental hx. (+) Missing, Dental Advisory Given, Poor Dentition,    Pulmonary former smoker   Pulmonary exam normal breath sounds clear to auscultation       Cardiovascular hypertension, Pt. on medications Normal cardiovascular exam Rhythm:Regular Rate:Normal     Neuro/Psych CVA, Residual Symptoms    GI/Hepatic   Endo/Other    Renal/GU ESRF and DialysisRenal diseaseT, Th, Sa     Musculoskeletal   Abdominal   Peds  Hematology Lab Results      Component                Value               Date                              HGB                      13.9                05/06/2022                HCT                      41.0                05/06/2022                          Anesthesia Other Findings   Reproductive/Obstetrics                             Anesthesia Physical Anesthesia Plan  ASA: 3  Anesthesia Plan: Regional   Post-op Pain Management: Regional block* and Minimal or no pain anticipated   Induction: Intravenous  PONV Risk Score and Plan: 2 and Ondansetron, Dexamethasone and Treatment may vary due to age or medical condition  Airway Management Planned: Natural Airway and Nasal Cannula  Additional Equipment: None  Intra-op Plan:   Post-operative Plan:   Informed Consent: I have reviewed the patients History and Physical, chart, labs and discussed the procedure including the risks, benefits and alternatives for the proposed anesthesia with the patient or authorized representative who has indicated his/her understanding and acceptance.     Dental advisory given  Plan Discussed with: CRNA and  Surgeon  Anesthesia Plan Comments: (R supraclavicular )       Anesthesia Quick Evaluation

## 2022-05-22 NOTE — Progress Notes (Signed)
PCP - Celedonio Miyamoto, MD Cardiologist - denies  PPM/ICD - denies  Chest x-ray - n/a EKG - 08/28/21 ECHO - 08/07/17  CPAP - n/a  Fasting Blood Sugar - n/a  Blood Thinner Instructions: n/a Patient was instructed: As of today, STOP taking any Aspirin (unless otherwise instructed by your surgeon) Aleve, Naproxen, Ibuprofen, Motrin, Advil, Goody's, BC's, all herbal medications, fish oil, and all vitamins.  ERAS Protcol - n/a  COVID TEST- n/a  Anesthesia review: no  Patient verbally denies any shortness of breath, fever, cough and chest pain during phone call   -------------  SDW INSTRUCTIONS given:  Your procedure is scheduled on Friday, February 2nd, 2024.  Report to [facility] Main Entrance "A" at 07:00 A.M., and check in at the Admitting office.  Call this number if you have problems the morning of surgery:  [phone]   Remember:  Do not eat or drink after midnight the night before your surgery    Take these medicines the morning of surgery with A SIP OF WATER: Coreg, Dilt - XR PRN: Tylenol (Patient states that he will not take any medicine on Friday morning).  The day of surgery:                     Do not wear jewelry,             Do not wear lotions, powders, colognes, or deodorant.            Men may shave face and neck.            Do not bring valuables to the hospital.            [facility] is not responsible for any belongings or valuables.  Do NOT Smoke (Tobacco/Vaping) 24 hours prior to your procedure If you use a CPAP at night, you may bring all equipment for your overnight stay.   Contacts, glasses, dentures or bridgework may not be worn into surgery.      For patients admitted to the hospital, discharge time will be determined by your treatment team.   Patients discharged the day of surgery will not be allowed to drive home, and someone needs to stay with them for 24 hours.    Special instructions:   [place]- Preparing For Surgery  Before  surgery, you can play an important role. Because skin is not sterile, your skin needs to be as free of germs as possible. You can reduce the number of germs on your skin by washing with CHG (chlorahexidine gluconate) Soap before surgery.  CHG is an antiseptic cleaner which kills germs and bonds with the skin to continue killing germs even after washing.    Oral Hygiene is also important to reduce your risk of infection.  Remember - BRUSH YOUR TEETH THE MORNING OF SURGERY WITH YOUR REGULAR TOOTHPASTE  Please do not use if you have an allergy to CHG or antibacterial soaps. If your skin becomes reddened/irritated stop using the CHG.  Do not shave (including legs and underarms) for at least 48 hours prior to first CHG shower. It is OK to shave your face.  Please follow these instructions carefully.   Shower the NIGHT BEFORE SURGERY and the MORNING OF SURGERY with DIAL Soap.   Pat yourself dry with a CLEAN TOWEL.  Wear CLEAN PAJAMAS to bed the night before surgery  Place CLEAN SHEETS on your bed the night of your first shower and DO NOT SLEEP WITH PETS.  Day of Surgery: Please shower morning of surgery  Wear Clean/Comfortable clothing the morning of surgery Do not apply any deodorants/lotions.   Remember to brush your teeth WITH YOUR REGULAR TOOTHPASTE.   Questions were answered. Patient verbalized understanding of instructions.

## 2022-05-23 ENCOUNTER — Encounter (HOSPITAL_COMMUNITY): Payer: Self-pay | Admitting: Surgery

## 2022-05-23 ENCOUNTER — Ambulatory Visit (HOSPITAL_BASED_OUTPATIENT_CLINIC_OR_DEPARTMENT_OTHER): Payer: Medicare Other | Admitting: Anesthesiology

## 2022-05-23 ENCOUNTER — Other Ambulatory Visit: Payer: Self-pay

## 2022-05-23 ENCOUNTER — Ambulatory Visit (HOSPITAL_COMMUNITY): Payer: Medicare Other | Admitting: Anesthesiology

## 2022-05-23 ENCOUNTER — Encounter (HOSPITAL_COMMUNITY)
Admission: RE | Disposition: A | Discharge status: Home / Self Care | Payer: Self-pay | Source: Ambulatory Visit | Attending: Surgery

## 2022-05-23 ENCOUNTER — Ambulatory Visit (HOSPITAL_COMMUNITY)
Admission: RE | Admit: 2022-05-23 | Discharge: 2022-05-23 | Disposition: A | Discharge status: Home / Self Care | Payer: Medicare Other | Source: Ambulatory Visit | Attending: Surgery | Admitting: Surgery

## 2022-05-23 DIAGNOSIS — I10 Essential (primary) hypertension: Secondary | ICD-10-CM | POA: Insufficient documentation

## 2022-05-23 DIAGNOSIS — I120 Hypertensive chronic kidney disease with stage 5 chronic kidney disease or end stage renal disease: Secondary | ICD-10-CM | POA: Insufficient documentation

## 2022-05-23 DIAGNOSIS — Z87891 Personal history of nicotine dependence: Secondary | ICD-10-CM | POA: Diagnosis not present

## 2022-05-23 DIAGNOSIS — Z992 Dependence on renal dialysis: Secondary | ICD-10-CM | POA: Insufficient documentation

## 2022-05-23 DIAGNOSIS — N186 End stage renal disease: Secondary | ICD-10-CM

## 2022-05-23 DIAGNOSIS — E1122 Type 2 diabetes mellitus with diabetic chronic kidney disease: Secondary | ICD-10-CM | POA: Insufficient documentation

## 2022-05-23 DIAGNOSIS — N185 Chronic kidney disease, stage 5: Secondary | ICD-10-CM

## 2022-05-23 DIAGNOSIS — I6990 Unspecified sequelae of unspecified cerebrovascular disease: Secondary | ICD-10-CM | POA: Insufficient documentation

## 2022-05-23 HISTORY — PX: LIGATION ARTERIOVENOUS GORTEX GRAFT: SHX5947

## 2022-05-23 LAB — POCT I-STAT, CHEM 8
BUN: 26 mg/dL — ABNORMAL HIGH (ref 8–23)
Calcium, Ion: 1.11 mmol/L — ABNORMAL LOW (ref 1.15–1.40)
Chloride: 97 mmol/L — ABNORMAL LOW (ref 98–111)
Creatinine, Ser: 5.9 mg/dL — ABNORMAL HIGH (ref 0.61–1.24)
Glucose, Bld: 110 mg/dL — ABNORMAL HIGH (ref 70–99)
HCT: 41 % (ref 39.0–52.0)
Hemoglobin: 13.9 g/dL (ref 13.0–17.0)
Potassium: 5 mmol/L (ref 3.5–5.1)
Sodium: 136 mmol/L (ref 135–145)
TCO2: 29 mmol/L (ref 22–32)

## 2022-05-23 SURGERY — LIGATION ARTERIOVENOUS GORTEX GRAFT
Anesthesia: Regional | Site: Arm Upper | Laterality: Right

## 2022-05-23 MED ORDER — FENTANYL CITRATE (PF) 100 MCG/2ML IJ SOLN: 25.0000 ug | INTRAMUSCULAR | Status: DC | PRN

## 2022-05-23 MED ORDER — SODIUM CHLORIDE 0.9 % IV SOLN: INTRAVENOUS | Status: DC

## 2022-05-23 MED ORDER — ONDANSETRON HCL 4 MG/2ML IJ SOLN
INTRAMUSCULAR | Status: DC | PRN
  Administered 2022-05-23: 4 mg via INTRAVENOUS

## 2022-05-23 MED ORDER — ONDANSETRON HCL 4 MG/2ML IJ SOLN: 4.0000 mg | Freq: Once | INTRAMUSCULAR | Status: DC | PRN

## 2022-05-23 MED ORDER — LACTATED RINGERS IV SOLN: INTRAVENOUS | Status: DC

## 2022-05-23 MED ORDER — BUPIVACAINE-EPINEPHRINE (PF) 0.5% -1:200000 IJ SOLN
INTRAMUSCULAR | Status: DC | PRN
  Administered 2022-05-23: 30 mL via PERINEURAL

## 2022-05-23 MED ORDER — CHLORHEXIDINE GLUCONATE 4 % EX LIQD: 60.0000 mL | Freq: Once | CUTANEOUS | Status: DC

## 2022-05-23 MED ORDER — HEPARIN 6000 UNIT IRRIGATION SOLUTION
Status: AC
  Filled 2022-05-23: qty 500

## 2022-05-23 MED ORDER — ORAL CARE MOUTH RINSE: 15.0000 mL | Freq: Once | OROMUCOSAL | Status: AC

## 2022-05-23 MED ORDER — SODIUM CHLORIDE 0.9 % IV SOLN
INTRAVENOUS | Status: DC | PRN
  Administered 2022-05-23: 37 mL

## 2022-05-23 MED ORDER — HEPARIN SODIUM (PORCINE) 1000 UNIT/ML IJ SOLN
INTRAMUSCULAR | Status: DC | PRN
  Administered 2022-05-23: 3000 [IU] via INTRAVENOUS

## 2022-05-23 MED ORDER — CHLORHEXIDINE GLUCONATE 0.12 % MT SOLN
15.0000 mL | Freq: Once | OROMUCOSAL | Status: AC
  Administered 2022-05-23: 15 mL via OROMUCOSAL
  Filled 2022-05-23: qty 15

## 2022-05-23 MED ORDER — LIDOCAINE 2% (20 MG/ML) 5 ML SYRINGE
INTRAMUSCULAR | Status: DC | PRN
  Administered 2022-05-23: 20 mg via INTRAVENOUS

## 2022-05-23 MED ORDER — PROPOFOL 10 MG/ML IV BOLUS
INTRAVENOUS | Status: DC | PRN
  Administered 2022-05-23: 80 mg via INTRAVENOUS

## 2022-05-23 MED ORDER — PROPOFOL 500 MG/50ML IV EMUL
INTRAVENOUS | Status: DC | PRN
  Administered 2022-05-23: 50 ug/kg/min via INTRAVENOUS

## 2022-05-23 MED ORDER — LIDOCAINE-EPINEPHRINE (PF) 1 %-1:200000 IJ SOLN
INTRAMUSCULAR | Status: DC | PRN
  Administered 2022-05-23: 5 mL

## 2022-05-23 MED ORDER — FENTANYL CITRATE (PF) 250 MCG/5ML IJ SOLN
INTRAMUSCULAR | Status: DC | PRN
  Administered 2022-05-23: 50 ug via INTRAVENOUS

## 2022-05-23 MED ORDER — SODIUM CHLORIDE 0.9 % IV SOLN: INTRAVENOUS | Status: DC | PRN

## 2022-05-23 MED ORDER — DIPHENHYDRAMINE HCL 50 MG/ML IJ SOLN
INTRAMUSCULAR | Status: DC | PRN
  Administered 2022-05-23: 12.5 mg via INTRAVENOUS

## 2022-05-23 MED ORDER — SURGIFLO WITH THROMBIN (HEMOSTATIC MATRIX KIT) OPTIME
TOPICAL | Status: DC | PRN
  Administered 2022-05-23: 1 via TOPICAL

## 2022-05-23 MED ORDER — LIDOCAINE-EPINEPHRINE (PF) 1 %-1:200000 IJ SOLN
INTRAMUSCULAR | Status: AC
  Filled 2022-05-23: qty 30

## 2022-05-23 MED ORDER — 0.9 % SODIUM CHLORIDE (POUR BTL) OPTIME
TOPICAL | Status: DC | PRN
  Administered 2022-05-23: 1000 mL

## 2022-05-23 MED ORDER — MIDAZOLAM HCL 2 MG/2ML IJ SOLN
INTRAMUSCULAR | Status: DC | PRN
  Administered 2022-05-23: 1 mg via INTRAVENOUS

## 2022-05-23 MED ORDER — DEXMEDETOMIDINE HCL IN NACL 80 MCG/20ML IV SOLN
INTRAVENOUS | Status: DC | PRN
  Administered 2022-05-23: 4 ug via BUCCAL

## 2022-05-23 MED ORDER — LIDOCAINE HCL 1 % IJ SOLN
INTRAMUSCULAR | Status: AC
  Filled 2022-05-23: qty 20

## 2022-05-23 MED ORDER — PHENYLEPHRINE 80 MCG/ML (10ML) SYRINGE FOR IV PUSH (FOR BLOOD PRESSURE SUPPORT)
PREFILLED_SYRINGE | INTRAVENOUS | Status: DC | PRN
  Administered 2022-05-23 (×2): 80 ug via INTRAVENOUS

## 2022-05-23 MED ORDER — BUPIVACAINE LIPOSOME 1.3 % IJ SUSP
INTRAMUSCULAR | Status: AC
  Filled 2022-05-23: qty 20

## 2022-05-23 MED ORDER — PROTAMINE SULFATE 10 MG/ML IV SOLN
INTRAVENOUS | Status: DC | PRN
  Administered 2022-05-23: 10 mg via INTRAVENOUS
  Administered 2022-05-23: 15 mg via INTRAVENOUS

## 2022-05-23 MED ORDER — ACETAMINOPHEN 10 MG/ML IV SOLN: 1000.0000 mg | Freq: Once | INTRAVENOUS | Status: DC | PRN

## 2022-05-23 MED ORDER — FENTANYL CITRATE (PF) 250 MCG/5ML IJ SOLN
INTRAMUSCULAR | Status: AC
  Filled 2022-05-23: qty 5

## 2022-05-23 MED ORDER — CEFAZOLIN SODIUM-DEXTROSE 2-4 GM/100ML-% IV SOLN
2.0000 g | INTRAVENOUS | Status: AC
  Administered 2022-05-23: 2 g via INTRAVENOUS
  Filled 2022-05-23: qty 100

## 2022-05-23 MED ORDER — PHENYLEPHRINE HCL-NACL 20-0.9 MG/250ML-% IV SOLN
INTRAVENOUS | Status: DC | PRN
  Administered 2022-05-23: 25 ug/min via INTRAVENOUS

## 2022-05-23 MED ORDER — OXYCODONE HCL 5 MG PO TABS: 5.0000 mg | ORAL_TABLET | ORAL | 0 refills | Status: DC | PRN

## 2022-05-23 MED ORDER — MIDAZOLAM HCL 2 MG/2ML IJ SOLN
INTRAMUSCULAR | Status: AC
  Filled 2022-05-23: qty 2

## 2022-05-23 MED ORDER — HEPARIN 6000 UNIT IRRIGATION SOLUTION
Status: DC | PRN
  Administered 2022-05-23: 1

## 2022-05-23 SURGICAL SUPPLY — 29 items
ARMBAND PINK RESTRICT EXTREMIT (MISCELLANEOUS) ×4 IMPLANT
BAG COUNTER SPONGE SURGICOUNT (BAG) ×2 IMPLANT
CANISTER SUCT 3000ML PPV (MISCELLANEOUS) ×2 IMPLANT
CLIP VESOCCLUDE MED 6/CT (CLIP) ×2 IMPLANT
CLIP VESOCCLUDE SM WIDE 6/CT (CLIP) ×2 IMPLANT
COVER PROBE W GEL 5X96 (DRAPES) ×2 IMPLANT
DERMABOND ADVANCED .7 DNX12 (GAUZE/BANDAGES/DRESSINGS) ×2 IMPLANT
ELECT REM PT RETURN 9FT ADLT (ELECTROSURGICAL) ×2
ELECTRODE REM PT RTRN 9FT ADLT (ELECTROSURGICAL) ×2 IMPLANT
GLOVE SURG SS PI 7.5 STRL IVOR (GLOVE) ×6 IMPLANT
GOWN STRL REUS W/ TWL LRG LVL3 (GOWN DISPOSABLE) ×4 IMPLANT
GOWN STRL REUS W/ TWL XL LVL3 (GOWN DISPOSABLE) ×2 IMPLANT
GOWN STRL REUS W/TWL LRG LVL3 (GOWN DISPOSABLE) ×4
GOWN STRL REUS W/TWL XL LVL3 (GOWN DISPOSABLE) ×2
GRAFT GORETEX STRT 4-7X45 (Vascular Products) ×1 IMPLANT
KIT BASIN OR (CUSTOM PROCEDURE TRAY) ×2 IMPLANT
KIT TURNOVER KIT B (KITS) ×2 IMPLANT
NS IRRIG 1000ML POUR BTL (IV SOLUTION) ×2 IMPLANT
PACK CV ACCESS (CUSTOM PROCEDURE TRAY) ×2 IMPLANT
PAD ARMBOARD 7.5X6 YLW CONV (MISCELLANEOUS) ×4 IMPLANT
SLING ARM FOAM STRAP MED (SOFTGOODS) IMPLANT
SURGIFLO W/THROMBIN 8M KIT (HEMOSTASIS) ×1 IMPLANT
SUT PROLENE 6 0 BV (SUTURE) ×2 IMPLANT
SUT PROLENE 6 0 CC (SUTURE) ×1 IMPLANT
SUT VIC AB 3-0 SH 27 (SUTURE) ×4
SUT VIC AB 3-0 SH 27X BRD (SUTURE) ×3 IMPLANT
SUT VICRYL 4-0 PS2 18IN ABS (SUTURE) ×3 IMPLANT
TOWEL GREEN STERILE (TOWEL DISPOSABLE) ×2 IMPLANT
WATER STERILE IRR 1000ML POUR (IV SOLUTION) ×2 IMPLANT

## 2022-05-23 NOTE — H&P (Signed)
   Patient name: David Leblanc MRN: [id number] DOB: Jul 11, 1950 Sex: male   HISTORY OF PRESENT ILLNESS:   David Leblanc is a 72 y.o. male with ESRD and a non maturing fistula on the left.  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  He is here for right sided access  CURRENT MEDICATIONS:    Current Facility-Administered Medications  Medication Dose Route Frequency Provider Last Rate Last Admin   0.9 %  sodium chloride infusion   Intravenous Continuous Taryne Kiger, Butch Penny, MD       ceFAZolin (ANCEF) IVPB 2g/100 mL premix  2 g Intravenous 30 min Pre-Op Serafina Mitchell, MD       chlorhexidine (HIBICLENS) 4 % liquid 4 Application  60 mL Topical Once Serafina Mitchell, MD       And   Derrill Memo ON 05/24/2022] chlorhexidine (HIBICLENS) 4 % liquid 4 Application  60 mL Topical Once Serafina Mitchell, MD       lactated ringers infusion   Intravenous Continuous Barnet Glasgow, MD        REVIEW OF SYSTEMS:   [X]  denotes positive finding, [ ]  denotes negative finding Cardiac  Comments:  Chest pain or chest pressure:    Shortness of breath upon exertion:    Short of breath when lying flat:    Irregular heart rhythm:    Constitutional    Fever or chills:      PHYSICAL EXAM:   Vitals:   05/23/22 0654  BP: 122/74  Pulse: 93  Resp: 16  Temp: 98.4 F (36.9 C)  TempSrc: Oral  SpO2: 98%  Weight: 64.5 kg  Height: 5\' 6"  (1.676 m)    GENERAL: The patient is a well-nourished male, in no acute distress. The vital signs are documented above. CARDIOVASCULAR: There is a regular rate and rhythm. PULMONARY: Non-labored respirations No palpable thrill in left arm fistula  STUDIES:      MEDICAL ISSUES:   ESRD:  patient needs a right arm access, graft vs fistula.  I do not feel a thrill on the left.  He has no swelling so I will not ligate it.  All questions answered  Leia Alf, MD, FACS [facility] [phone] Pager [phone]

## 2022-05-23 NOTE — Anesthesia Postprocedure Evaluation (Signed)
Anesthesia Post Note  Patient: David Leblanc  Procedure(s) Performed: INSERTION OF RIGHT ARM ARTERIOVENOUS GORTEX GRAFT (Right: Arm Upper)     Patient location during evaluation: PACU Anesthesia Type: Regional and General Level of consciousness: awake and alert Pain management: pain level controlled Vital Signs Assessment: post-procedure vital signs reviewed and stable Respiratory status: spontaneous breathing, nonlabored ventilation, respiratory function stable and patient connected to nasal cannula oxygen Cardiovascular status: blood pressure returned to baseline and stable Postop Assessment: no apparent nausea or vomiting Anesthetic complications: no  No notable events documented.  Last Vitals:  Vitals:   05/23/22 0945 05/23/22 1000  BP: (!) 86/57 (!) 95/55  Pulse: 86 86  Resp: 17 17  Temp: 36.7 C 36.7 C  SpO2: 99% 100%    Last Pain:  Vitals:   05/23/22 0945  TempSrc:   PainSc: 0-No pain                 Barnet Glasgow

## 2022-05-23 NOTE — Anesthesia Procedure Notes (Signed)
Procedure Name: LMA Insertion Date/Time: 05/23/2022 8:07 AM  Performed by: Mariea Clonts, CRNAPre-anesthesia Checklist: Patient identified, Emergency Drugs available, Suction available and Patient being monitored Patient Re-evaluated:Patient Re-evaluated prior to induction Oxygen Delivery Method: Circle System Utilized Preoxygenation: Pre-oxygenation with 100% oxygen Induction Type: IV induction Ventilation: Mask ventilation without difficulty LMA: LMA inserted LMA Size: 4.0 Number of attempts: 1 Airway Equipment and Method: Bite block Placement Confirmation: positive ETCO2 Tube secured with: Tape Dental Injury: Teeth and Oropharynx as per pre-operative assessment

## 2022-05-23 NOTE — Anesthesia Procedure Notes (Signed)
Procedure Name: MAC Date/Time: 05/23/2022 7:36 AM  Performed by: Mariea Clonts, CRNAPre-anesthesia Checklist: Patient identified, Emergency Drugs available, Suction available, Patient being monitored and Timeout performed Patient Re-evaluated:Patient Re-evaluated prior to induction Oxygen Delivery Method: Simple face mask

## 2022-05-23 NOTE — Transfer of Care (Signed)
Immediate Anesthesia Transfer of Care Note  Patient: David Leblanc  Procedure(s) Performed: INSERTION OF RIGHT ARM ARTERIOVENOUS GORTEX GRAFT (Right: Arm Upper)  Patient Location: PACU  Anesthesia Type:MAC, General, and Regional  Level of Consciousness: awake, alert , and oriented  Airway & Oxygen Therapy: Patient Spontanous Breathing and Patient connected to nasal cannula oxygen  Post-op Assessment: Report given to RN and Post -op Vital signs reviewed and stable  Post vital signs: Reviewed and stable  Last Vitals:  Vitals Value Taken Time  BP 109/55 05/23/22 0924  Temp    Pulse 87 05/23/22 0928  Resp 16 05/23/22 0928  SpO2 100 % 05/23/22 0928  Vitals shown include unvalidated device data.  Last Pain:  Vitals:   05/23/22 0654  TempSrc: Oral  PainSc:          Complications: No notable events documented.

## 2022-05-23 NOTE — Op Note (Signed)
    Patient name: David Leblanc MRN: [id number] DOB: 04-Nov-1950 Sex: male  05/23/2022 Pre-operative Diagnosis: ESRD Post-operative diagnosis:  Same Surgeon:  Annamarie Major Assistants:  Leontine Locket, PA Procedure:   Right upper arm Gore-Tex graft (4 x 7) Anesthesia:  General Blood Loss:  minimal Specimens:  none  Findings: 4 mm brachial artery.  Venous anastomosis was end to side to a 4-5 mm proximal brachial vein  Indications: This is a 72 year old gentleman who comes in today for right-sided dialysis access.  He previously had a left arm fistula which did not mature because of a central vein occlusion.  Procedure:  The patient was identified in the holding area and taken to [facility]  The patient was then placed supine on the table. general anesthesia was administered.  The patient was prepped and draped in the usual sterile fashion.  A time out was called and antibiotics were administered.  A PA was necessary to expedite the procedure and assist with technical details.  She help with exposure by providing suction and retraction.  She help with the anastomoses by following the suture.  She help with wound closure.  Ultrasound was used to evaluate the surface veins in the upper arm which did not appear to be adequate for fistula creation.  I also evaluated the brachial vein at the antecubital crease which was small.  Therefore elected to proceed with an upper arm graft.  8 longitudinal incision was made just proximal to the antecubital crease.  The brachial artery was dissected free and encircled with Vesseloops.  This was a 4 mm disease-free artery.  Next, a curvilinear incision was made at the axillary hairline.  Through this incision the proximal brachial vein was dissected free.  This was a 4 mm vein.  Next I marked out a location for the tunnel with a pen on the skin.  I then infiltrated Exparel within the tunnel.  A curved Gore tunneler was used to make the tunnel and a 4 x 7 graft was  brought through the tunneler.  The patient was given 3000 units of heparin.  The brachial artery was occluded with vascular clamps and opened with a #11 blade.  The arteriotomy was extended longitudinally with Potts scissors.  The graft was beveled to fit the size the arteriotomy in a running anastomosis was created with 6-0 Prolene.  The appropriate flushing maneuvers were performed and the clamps were released.  There was excellent flow through the graft.  The graft was flushed with heparin saline and reoccluded.  Attention was turned towards the brachial vein.  This was occluded with vascular clamps and a #11 blade was used to make a venotomy which was extended longitudinally with Potts scissors.  The graft was cut to the appropriate length and then beveled to fit the size the venotomy.  A running end to side anastomosis was performed with 6-0 Prolene.  Prior to completion, the appropriate flushing maneuvers were performed and the anastomosis was completed.  There was an excellent thrill within the graft and the patient had a palpable radial pulse.  The wounds were then irrigated.  Hemostasis was achieved.  In both incisions, the deep tissue was reapproximated with 3-0 Vicryl and the skin was closed with 4-0 Monocryl followed by Dermabond.  There were no immediate complications.   Disposition: To PACU stable.   Theotis Burrow, M.D., [facility] [place] Office: [phone] Pager:  [phone]

## 2022-05-23 NOTE — Anesthesia Procedure Notes (Signed)
Anesthesia Regional Block: Supraclavicular block   Pre-Anesthetic Checklist: , timeout performed,  Correct Patient, Correct Site, Correct Laterality,  Correct Procedure, Correct Position, site marked,  Risks and benefits discussed,  Surgical consent,  Pre-op evaluation,  At surgeon's request and post-op pain management  Laterality: Upper and Right  Prep: chloraprep       Needles:  Injection technique: Single-shot  Needle Type: Echogenic Needle     Needle Length: 5cm  Needle Gauge: 21     Additional Needles:   Procedures:,,,, ultrasound used (permanent image in chart),,     Nerve Stimulator or Paresthesia:   Additional Responses:  Block tested.  Patient tolerated procedure well Narrative:  Start time: 05/23/2022 7:08 AM End time: 05/23/2022 7:15 AM Injection made incrementally with aspirations every 5 mL.  Performed by: Personally  Anesthesiologist: Barnet Glasgow, MD  Additional Notes: Block tested. Patient tolerated procedure well.

## 2022-05-24 ENCOUNTER — Encounter (HOSPITAL_COMMUNITY): Payer: Self-pay | Admitting: Surgery

## 2023-05-06 ENCOUNTER — Other Ambulatory Visit: Payer: Self-pay

## 2023-05-06 ENCOUNTER — Ambulatory Visit (HOSPITAL_COMMUNITY)
Admission: RE | Admit: 2023-05-06 | Discharge: 2023-05-06 | Disposition: A | Discharge status: Home / Self Care | Payer: Medicare Other | Attending: Nephrology | Admitting: Nephrology

## 2023-05-06 ENCOUNTER — Encounter (HOSPITAL_COMMUNITY): Payer: Self-pay | Admitting: Nephrology

## 2023-05-06 ENCOUNTER — Encounter (HOSPITAL_COMMUNITY)
Admission: RE | Disposition: A | Discharge status: Home / Self Care | Payer: Self-pay | Source: Home / Self Care | Attending: Nephrology

## 2023-05-06 DIAGNOSIS — N186 End stage renal disease: Secondary | ICD-10-CM | POA: Insufficient documentation

## 2023-05-06 DIAGNOSIS — Y832 Surgical operation with anastomosis, bypass or graft as the cause of abnormal reaction of the patient, or of later complication, without mention of misadventure at the time of the procedure: Secondary | ICD-10-CM | POA: Insufficient documentation

## 2023-05-06 DIAGNOSIS — I120 Hypertensive chronic kidney disease with stage 5 chronic kidney disease or end stage renal disease: Secondary | ICD-10-CM | POA: Diagnosis not present

## 2023-05-06 DIAGNOSIS — D631 Anemia in chronic kidney disease: Secondary | ICD-10-CM | POA: Diagnosis not present

## 2023-05-06 DIAGNOSIS — N250 Renal osteodystrophy: Secondary | ICD-10-CM | POA: Diagnosis not present

## 2023-05-06 DIAGNOSIS — T82858A Stenosis of vascular prosthetic devices, implants and grafts, initial encounter: Secondary | ICD-10-CM | POA: Insufficient documentation

## 2023-05-06 DIAGNOSIS — Z992 Dependence on renal dialysis: Secondary | ICD-10-CM | POA: Insufficient documentation

## 2023-05-06 DIAGNOSIS — Z87891 Personal history of nicotine dependence: Secondary | ICD-10-CM | POA: Insufficient documentation

## 2023-05-06 HISTORY — PX: A/V FISTULAGRAM: CATH118298

## 2023-05-06 HISTORY — PX: PERIPHERAL VASCULAR INTERVENTION: CATH118257

## 2023-05-06 SURGERY — A/V FISTULAGRAM
Anesthesia: LOCAL

## 2023-05-06 MED ORDER — ACETAMINOPHEN 325 MG PO TABS
ORAL_TABLET | ORAL | Status: AC
  Filled 2023-05-06: qty 2

## 2023-05-06 MED ORDER — MIDAZOLAM HCL 2 MG/2ML IJ SOLN
INTRAMUSCULAR | Status: AC
  Filled 2023-05-06: qty 2

## 2023-05-06 MED ORDER — ACETAMINOPHEN 325 MG PO TABS
650.0000 mg | ORAL_TABLET | ORAL | Status: DC | PRN
  Administered 2023-05-06: 650 mg via ORAL

## 2023-05-06 MED ORDER — ONDANSETRON HCL 4 MG/2ML IJ SOLN: 4.0000 mg | Freq: Four times a day (QID) | INTRAMUSCULAR | Status: DC | PRN

## 2023-05-06 MED ORDER — IODIXANOL 320 MG/ML IV SOLN
INTRAVENOUS | Status: DC | PRN
  Administered 2023-05-06: 15 mL via INTRA_ARTERIAL

## 2023-05-06 MED ORDER — FENTANYL CITRATE (PF) 100 MCG/2ML IJ SOLN
INTRAMUSCULAR | Status: DC | PRN
  Administered 2023-05-06: 50 ug via INTRAVENOUS

## 2023-05-06 MED ORDER — SODIUM CHLORIDE 0.9% FLUSH: 3.0000 mL | INTRAVENOUS | Status: DC | PRN

## 2023-05-06 MED ORDER — SODIUM CHLORIDE 0.9 % IV SOLN: INTRAVENOUS | Status: DC

## 2023-05-06 MED ORDER — MIDAZOLAM HCL 2 MG/2ML IJ SOLN
INTRAMUSCULAR | Status: DC | PRN
  Administered 2023-05-06: 1 mg via INTRAVENOUS

## 2023-05-06 MED ORDER — FENTANYL CITRATE (PF) 100 MCG/2ML IJ SOLN
INTRAMUSCULAR | Status: AC
  Filled 2023-05-06: qty 2

## 2023-05-06 MED ORDER — LIDOCAINE HCL (PF) 1 % IJ SOLN
INTRAMUSCULAR | Status: DC | PRN
  Administered 2023-05-06: 5 mL via INTRADERMAL

## 2023-05-06 MED ORDER — LIDOCAINE HCL (PF) 1 % IJ SOLN
INTRAMUSCULAR | Status: AC
  Filled 2023-05-06: qty 30

## 2023-05-06 SURGICAL SUPPLY — 13 items
BAG SNAP BAND KOVER 36X36 (MISCELLANEOUS) ×2 IMPLANT
BALLN MUSTANG 7X80X75 (BALLOONS) ×2
BALLOON MUSTANG 7X80X75 (BALLOONS) IMPLANT
CATH ANGIO 5F BER2 65CM (CATHETERS) IMPLANT
CATH TEMPO 5F RIM 65CM (CATHETERS) IMPLANT
COVER DOME SNAP 22 D (MISCELLANEOUS) ×2 IMPLANT
GUIDEWIRE ANGLED .035X150CM (WIRE) IMPLANT
SHEATH PINNACLE 8F 10CM (SHEATH) IMPLANT
SHEATH PINNACLE R/O II 6F 4CM (SHEATH) IMPLANT
STENT VIABAHN 8X50X75 (Permanent Stent) IMPLANT
SYR MEDALLION 10ML (SYRINGE) IMPLANT
TRAY PV CATH (CUSTOM PROCEDURE TRAY) ×2 IMPLANT
WIRE STARTER BENTSON 035X150 (WIRE) IMPLANT

## 2023-05-06 NOTE — Op Note (Signed)
 Right upper arm AV graft with the inflow 4-7 taper on May 23, 2022.  He also occasionally has pain in the axillary region .  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 Charlotte Cookey on May 06, 2022 .   On exam the RUA straight AVG is hyperpulsatile.   Summary:  1) Successful angiogram of a left upper arm straight AVG   with evidence of a 90% venous anastomosis to axillary vein stenosis treated to 10% with a 7 mm Mustang FE ~12 atm + 8x5 Viabahn.  Stent deployed because if this graft becomes thrombosed we will not be able to declot it with the severity of VA stenosis plus the angle of entry into the brachial/axillary vein. 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 3) This left upper arm straight graft remains amenable to future percutaneous intervention.   Description of procedure: The left upper arm was prepped and draped in the usual fashion. The left upper arm straight AVG was cannulated (16109) in the arterial limb of the graft in an antegrade direction with an 18G Angiocath needle and then a 6 Fr sheath was inserted by guidewire exchange technique. The angiogram revealed a patent body of the graft with a 80% venous anastomosis stenosis. The body of the graft, central veins and inflow were patent.  With difficulty a angled glide guidewire was advanced past the high grade outflow stenosis with the aid of a rim catheter and parked in the central veins.  I also exchanged out over the wire to a Bernstein catheter with injection when the tip was in the axillary vein confirming that we were indeed in a nice caliber approximately 10 mm axillary vein.  I then advanced a 7 x 8 Mustang angioplasty balloon through the antegrade sheath over the guidewire to the level of the venous anastomosis to axillary vein stenosis. Venous angioplasty was performed to 100% balloon  effacement with approximately 14 ATM of pressure via a hand syringe assembly.    A decision was made to repair the outflow VA with a stent graft because of the severity of the outflow VA stenosis and the angle of entry of the graft into the outflow brachial/axillary vein.  This took greater than 15 minutes to cross that severe outflow lesion and if this graft were to clot off a thrombectomy would not be possible.  A 8x5  Viabahn stent graft was deployed at the outflow basilic vein swing site stenosis (60454);  I followed this with a 7 mm Mustang balloon angioplasty of the stent graft overinflated to ~7.62mm.  Final arteriogram and completion venogram revealed no evidence of extravasation or dissection, more rapid access flows through the graft and 10% residual stenosis in the venous anastomosis.  Outflow Is now all going antegrade into the axillary vein with very rapid flows.  Hemostasis: A 3-0 ethilon purse string suture was placed at the cannulation site on removal of the sheath.  Sedation: 1 mg Versed , 50 mcg Fentanyl .  Sedation time: 31 minutes  Contrast. 15 mL  Monitoring: Because of the patient's comorbid conditions and sedation during the procedure, continuous EKG monitoring and O2 saturation monitoring was performed throughout the procedure by the RN. There were no abnormal arrhythmias encountered.  Complications: None.   Diagnoses: I87.1 Stricture of vein  N18.6 ESRD T82.858A Stricture of access  Procedure Coding:  [phone] Cannulation and angiogram  of fistula, venous angioplasty (AVG VA), stent deployment [phone] Contrast  Recommendations:  1. Continue to cannulate the fistula with 15G needles.  2. Refer back for problems with flows. 3. Remove the suture next treatment.   Discharge: The patient was discharged home in stable condition. The patient was given education regarding the care of the dialysis access AVF and specific instructions in case of any problems.

## 2023-05-06 NOTE — H&P (Addendum)
 Chief Complaint: Decreased flows  Interval H&P  The patient has presented today for an angiogram/ angioplasty; patient is followed at [facility] with Dr. Christianne Cowper.  Various methods of treatment have been discussed with the patient.  After consideration of risk, benefits and other options for treatment, the patient has consented to a angiogram/ angioplasty with  possible stent placement.   Risks of angiogram with potential angioplasty and stenting if needed.contrast reaction, extravasation/ bleeding, dissection, hypotension and death were explained to the patient.  The patient's history has been reviewed and the patient has been examined, no changes in status.  Stable for angiogram/angioplasty  I have reviewed the patient's chart and labs.  Questions were answered to the patient's satisfaction.  Assessment/Plan: ESRD dialyzing at [facility] T[facility] regimen with last dialysis Tuesday Decreased access flows - planning on angiogram with possibly angioplasty.  Dr. 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  Right upper arm AV graft with the inflow 4-7 taper on May 23, 2022.  He also occasionally has pain in the axillary region. Renal osteodystrophy - continue binders per home regimen. Anemia - managed with ESA's and IV iron at dialysis center. HTN - resume home regimen.   HPI: David Leblanc is an 73 y.o. male with a history of hypertension, ESRD.  Patient has had a history of tunneled catheters on both sides with unfortunately occlusion of the left sided central veins.  Patient currently has a right upper arm AV graft placed May 23, 2022 by Dr. Charlotte Cookey now being referred for occasional pain in the axillary region.  Pt denies fever, chills, nausea, vomiting, myalgias, SOB, CP.   ROS Per HPI.  Chemistry and CBC: Creatinine, Ser  Date/Time Value Ref Range Status  05/23/2022 06:44 AM 5.90 (H) 0.61 - 1.24 mg/dL Final  16/01/9603 54:09 AM 5.20 (H) 0.61 -  1.24 mg/dL Final  81/19/1478 29:56 AM 4.20 (H) 0.61 - 1.24 mg/dL Final  21/30/8657 84:69 PM 9.44 (H) 0.61 - 1.24 mg/dL Final  62/95/2841 32:44 PM 8.16 (H) 0.61 - 1.24 mg/dL Final  04/23/7251 66:44 AM 2.51 (H) 0.61 - 1.24 mg/dL Final  03/47/4259 56:38 PM 2.71 (H) 0.61 - 1.24 mg/dL Final   No results for input(s): "NA", "K", "CL", "CO2", "GLUCOSE", "BUN", "CREATININE", "CALCIUM", "PHOS" in the last 168 hours.  Invalid input(s): "ALB" No results for input(s): "WBC", "NEUTROABS", "HGB", "HCT", "MCV", "PLT" in the last 168 hours. Liver Function Tests: No results for input(s): "AST", "ALT", "ALKPHOS", "BILITOT", "PROT", "ALBUMIN" in the last 168 hours. No results for input(s): "LIPASE", "AMYLASE" in the last 168 hours. No results for input(s): "AMMONIA" in the last 168 hours. Cardiac Enzymes: No results for input(s): "CKTOTAL", "CKMB", "CKMBINDEX", "TROPONINI" in the last 168 hours. Iron Studies: No results for input(s): "IRON", "TIBC", "TRANSFERRIN", "FERRITIN" in the last 72 hours. PT/INR: @LABRCNTIP (inr:5)  Xrays/Other Studies: )No results found for this or any previous visit (from the past 48 hours). No results found.  PMH:   Past Medical History:  Diagnosis Date   Anemia    Chronic kidney disease    T-TH-S   ESRD (end stage renal disease) (HCC)    Hypertension    Tobacco use     PSH:   Past Surgical History:  Procedure Laterality Date   A/V FISTULAGRAM Left 05/06/2022   Procedure: A/V Fistulagram;  Surgeon: Margherita Shell, MD;  Location: [facility] INVASIVE CV LAB;  Service: Cardiovascular;  Laterality: Left;   AV FISTULA PLACEMENT Left 01/17/2022  Procedure: LEFT ARTERIOVENOUS (AV) FISTULA CREATION;  Surgeon: Margherita Shell, MD;  Location: [facility] OR;  Service: Vascular;  Laterality: Left;   EYE SURGERY     LIGATION ARTERIOVENOUS GORTEX GRAFT Right 05/23/2022   Procedure: INSERTION OF RIGHT ARM ARTERIOVENOUS GORTEX GRAFT;  Surgeon: Margherita Shell, MD;  Location: [facility] OR;  Service:  Vascular;  Laterality: Right;   NO PAST SURGERIES      Allergies: No Known Allergies  Medications:   Prior to Admission medications   Medication Sig Start Date End Date Taking? Authorizing Provider  DILT-XR 240 MG 24 hr capsule Take 240 mg by mouth daily. Take on Sun, Mon, Wed, and Fri (non-dialysis days) 12/15/21  Yes [provider]  Multiple Vitamin (MULTIVITAMIN WITH MINERALS) TABS tablet Take 1 tablet by mouth in the morning.   Yes [provider]  traZODone (DESYREL) 100 MG tablet Take 100 mg by mouth at bedtime as needed for sleep.   Yes [provider]    Discontinued Meds:   Medications Discontinued During This Encounter  Medication Reason   carvedilol  (COREG ) 3.125 MG tablet Patient Preference   oxyCODONE  (ROXICODONE ) 5 MG immediate release tablet Patient Preference   traZODone (DESYREL) 100 MG tablet Patient Preference   sevelamer carbonate (RENVELA) 800 MG tablet Patient Preference   furosemide  (LASIX ) 40 MG tablet Patient Preference   acetaminophen  (TYLENOL ) 500 MG tablet Patient Preference    Social History:  reports that he quit smoking about 20 months ago. His smoking use included cigarettes. He started smoking about 41 years ago. He has a 10 pack-year smoking history. He has never been exposed to tobacco smoke. He has never used smokeless tobacco. He reports that he does not currently use alcohol. He reports that he does not use drugs.  Family History:   Family History  Problem Relation Age of Onset   Hypertension Father     There were no vitals taken for this visit. GEN: NAD, A&Ox3, NCAT HEENT: No conjunctival pallor, EOMI NECK: Supple, no thyromegaly LUNGS: CTA B/L no rales, rhonchi or wheezing CV: RRR, No M/R/G ABD: SNDNT +BS  EXT: No lower extremity edema ACCESS: RUA AVG       Patrick Boor, MD 05/06/2023, 8:41 AM

## 2023-05-06 NOTE — Discharge Instructions (Signed)

## 2023-05-07 ENCOUNTER — Encounter (HOSPITAL_COMMUNITY): Payer: Self-pay | Admitting: Nephrology

## 2023-12-03 ENCOUNTER — Encounter (HOSPITAL_COMMUNITY): Payer: Self-pay | Admitting: Surgery

## 2023-12-04 ENCOUNTER — Other Ambulatory Visit: Payer: Self-pay

## 2023-12-04 ENCOUNTER — Ambulatory Visit (HOSPITAL_COMMUNITY)
Admission: RE | Admit: 2023-12-04 | Discharge: 2023-12-04 | Disposition: A | Discharge status: Home / Self Care | Attending: Surgery | Admitting: Surgery

## 2023-12-04 ENCOUNTER — Encounter (HOSPITAL_COMMUNITY)
Admission: RE | Disposition: A | Discharge status: Home / Self Care | Payer: Self-pay | Source: Home / Self Care | Attending: Surgery

## 2023-12-04 DIAGNOSIS — Z992 Dependence on renal dialysis: Secondary | ICD-10-CM | POA: Diagnosis not present

## 2023-12-04 DIAGNOSIS — T82868A Thrombosis of vascular prosthetic devices, implants and grafts, initial encounter: Secondary | ICD-10-CM | POA: Diagnosis present

## 2023-12-04 DIAGNOSIS — Z87891 Personal history of nicotine dependence: Secondary | ICD-10-CM | POA: Diagnosis not present

## 2023-12-04 DIAGNOSIS — I120 Hypertensive chronic kidney disease with stage 5 chronic kidney disease or end stage renal disease: Secondary | ICD-10-CM | POA: Diagnosis not present

## 2023-12-04 DIAGNOSIS — N186 End stage renal disease: Secondary | ICD-10-CM | POA: Insufficient documentation

## 2023-12-04 DIAGNOSIS — Y832 Surgical operation with anastomosis, bypass or graft as the cause of abnormal reaction of the patient, or of later complication, without mention of misadventure at the time of the procedure: Secondary | ICD-10-CM | POA: Insufficient documentation

## 2023-12-04 HISTORY — PX: VENOUS STENT: CATH118377

## 2023-12-04 HISTORY — PX: PERIPHERAL VASCULAR THROMBECTOMY: CATH118306

## 2023-12-04 SURGERY — PERIPHERAL VASCULAR THROMBECTOMY
Anesthesia: LOCAL | Site: Arm Upper | Laterality: Right

## 2023-12-04 MED ORDER — LIDOCAINE HCL (PF) 1 % IJ SOLN
INTRAMUSCULAR | Status: DC | PRN
  Administered 2023-12-04: 2 mL via INTRADERMAL

## 2023-12-04 MED ORDER — IODIXANOL 320 MG/ML IV SOLN
INTRAVENOUS | Status: DC | PRN
  Administered 2023-12-04: 15 mL

## 2023-12-04 MED ORDER — LIDOCAINE HCL (PF) 1 % IJ SOLN
INTRAMUSCULAR | Status: AC
  Filled 2023-12-04: qty 30

## 2023-12-04 MED ORDER — HEPARIN SODIUM (PORCINE) 1000 UNIT/ML IJ SOLN
INTRAMUSCULAR | Status: DC | PRN
  Administered 2023-12-04: 7000 [IU] via INTRAVENOUS

## 2023-12-04 MED ORDER — HEPARIN SODIUM (PORCINE) 1000 UNIT/ML IJ SOLN
INTRAMUSCULAR | Status: AC
  Filled 2023-12-04: qty 10

## 2023-12-04 MED ORDER — MIDAZOLAM HCL 2 MG/2ML IJ SOLN
INTRAMUSCULAR | Status: AC
  Filled 2023-12-04: qty 2

## 2023-12-04 MED ORDER — HEPARIN (PORCINE) IN NACL 1000-0.9 UT/500ML-% IV SOLN
INTRAVENOUS | Status: DC | PRN
  Administered 2023-12-04: 500 mL

## 2023-12-04 MED ORDER — FENTANYL CITRATE (PF) 100 MCG/2ML IJ SOLN
INTRAMUSCULAR | Status: AC
  Filled 2023-12-04: qty 2

## 2023-12-04 MED ORDER — MIDAZOLAM HCL 2 MG/2ML IJ SOLN
INTRAMUSCULAR | Status: DC | PRN
  Administered 2023-12-04: 1 mg via INTRAVENOUS

## 2023-12-04 MED ORDER — FENTANYL CITRATE (PF) 100 MCG/2ML IJ SOLN
INTRAMUSCULAR | Status: DC | PRN
  Administered 2023-12-04: 50 ug via INTRAVENOUS

## 2023-12-04 SURGICAL SUPPLY — 14 items
BALLOON FOGARTY 5FR 40 (CATHETERS) IMPLANT
BALLOON MUSTANG 7X80X75 (BALLOONS) IMPLANT
CATH ANGIO 5F BER2 65CM (CATHETERS) IMPLANT
CATH STR 7FR 55 BRITE (CATHETERS) IMPLANT
GLIDEWIRE ADV .035X180CM (WIRE) IMPLANT
KIT ENCORE 26 ADVANTAGE (KITS) IMPLANT
KIT MICROPUNCTURE NIT STIFF (SHEATH) IMPLANT
SHEATH PINNACLE 8F 10CM (SHEATH) IMPLANT
SHEATH PINNACLE R/O II 6F 4CM (SHEATH) IMPLANT
SHEATH PINNACLE R/O II 7F 4CM (SHEATH) IMPLANT
STENT VIABAHN 8X50X75 (Permanent Stent) IMPLANT
TRAY PV CATH (CUSTOM PROCEDURE TRAY) ×2 IMPLANT
TUBING CIL FLEX 10 FLL-RA (TUBING) IMPLANT
WIRE BENTSON .035X145CM (WIRE) IMPLANT

## 2023-12-04 NOTE — H&P (Addendum)
 Vascular and Vein Specialist of [facility]  Patient name: David Leblanc MRN: [id number] DOB: 06-Dec-1950 Sex: male   REASON FOR VISIT:    Dialysis access issues  HISOTRY OF PRESENT ILLNESS:    David Leblanc is a 73 y.o. male who dialyzes with a right 4 7 taper graft placed in 2024.  In January 2025 the venous outflow was stented with an 8 mm Viabahn.  This graft is now occluded and he comes in today for attempted salvage.   PAST MEDICAL HISTORY:   Past Medical History:  Diagnosis Date   Anemia    Chronic kidney disease    T-TH-S   ESRD (end stage renal disease) (HCC)    Hypertension    Tobacco use      FAMILY HISTORY:   Family History  Problem Relation Age of Onset   Hypertension Father     SOCIAL HISTORY:   Social History   Tobacco Use   Smoking status: Former    Current packs/day: 0.00    Average packs/day: 0.3 packs/day for 40.0 years (10.0 ttl pk-yrs)    Types: Cigarettes    Start date: 09/02/1981    Quit date: 09/02/2021    Years since quitting: 2.2    Passive exposure: Never   Smokeless tobacco: Never  Substance Use Topics   Alcohol use: Not Currently    Comment: 2 shots per day     ALLERGIES:   No Known Allergies   CURRENT MEDICATIONS:   No current facility-administered medications for this encounter.    REVIEW OF SYSTEMS:   [X]  denotes positive finding, [ ]  denotes negative finding Cardiac  Comments:  Chest pain or chest pressure:    Shortness of breath upon exertion:    Short of breath when lying flat:    Irregular heart rhythm:        Vascular    Pain in calf, thigh, or hip brought on by ambulation:    Pain in feet at night that wakes you up from your sleep:     Blood clot in your veins:    Leg swelling:         Pulmonary    Oxygen at home:    Productive cough:     Wheezing:         Neurologic    Sudden weakness in arms or legs:     Sudden numbness in arms or legs:     Sudden onset of  difficulty speaking or slurred speech:    Temporary loss of vision in one eye:     Problems with dizziness:         Gastrointestinal    Blood in stool:     Vomited blood:         Genitourinary    Burning when urinating:     Blood in urine:        Psychiatric    Major depression:         Hematologic    Bleeding problems:    Problems with blood clotting too easily:        Skin    Rashes or ulcers:        Constitutional    Fever or chills:      PHYSICAL EXAM:   Vitals:   12/04/23 1010 12/04/23 1024  BP: (!) 142/60 (!) 142/60  Pulse: 90 80  Resp: 12 14  Temp: 98.1 F (36.7 C)   TempSrc: Oral   SpO2: 95% 95%    GENERAL:  The patient is a well-nourished male, in no acute distress. The vital signs are documented above. CARDIAC: There is a regular rate and rhythm.  VASCULAR: Occluded right arm access PULMONARY: Non-labored respirations SKIN: There are no ulcers or rashes noted. PSYCHIATRIC: The patient has a normal affect.  STUDIES:    MEDICAL ISSUES:   ESRD: Has proceeding with an attempted thrombectomy of his right arm access.  If unsuccessful a catheter will need to be placed and we will need to consider new access.  He understands this and wishes to proceed    Malvina Serene CLORE, MD, FACS Vascular and Vein Specialists of [facility] [phone] Pager [phone]

## 2023-12-04 NOTE — Op Note (Signed)
    Patient name: David Leblanc MRN: [id number] DOB: 03/04/1951 Sex: male  12/04/2023 Pre-operative Diagnosis: Occluded right upper arm dialysis graft Post-operative diagnosis:  Same Surgeon:  Malvina New Procedure Performed:  1.  Ultrasound-guided access, right upper arm dialysis graft x 2 (antegrade and retrograde)  2.  Mechanical and aspiration thrombectomy of the right upper arm dialysis graft  3.  Shuntogram  4.  Stent, brachial vein (Viabahn 8 x 5 )  5.  Conscious sedation, 27 minutes   Indications: This is a 73 year old gentleman with right upper arm dialysis graft that is no longer functioning.  He comes in today for intervention.  Procedure:  The patient was identified in the holding area and taken to room 8.  The patient was then placed supine on the table and prepped and draped in the usual sterile fashion.  A time out was called.  Conscious sedation was administered with the use of IV fentanyl and Versed under continuous physician and nurse monitoring.  Heart rate, blood pressure, and oxygen saturations were continuously monitored.  Total sedation time was 27 minutes ultrasound was used to evaluate the graft, it appeared occluded.  The graft was then accessed under ultrasound guidance with a micropuncture needle.  I directed a Glidewire advantage into the central venous system and then placed a 7 [place] sheath.  A contrast injection was performed with a cath in the subclavian vein confirming patency of the central venous system.  7000 units of heparin as well as fentanyl and Versed were administered.  I then perform aspiration thrombectomy using a 7 French catheter with several passes.  I then performed balloon maceration of the graft using a 7 x 80 balloon.  Next a second access in the graft was obtained under ultrasound guidance directed towards the arterial anastomosis.  A 6 French sheath was placed.  A over the wire Fogarty thrombectomy catheter was used to remove the arterial plug  and sweep the proximal graft.  2 separate passes were performed.  There was excellent thrill within the graft.  Then with the catheter in the brachial artery, completion imaging was performed which showed a patent graft with no residual thrombus.  There did appear to be a significant narrowing at the distal edge of the previously placed stent since I had already treated this with venoplasty multiple times I felt this needed to be stented and so a 8 x 50 Viabahn was deployed and postdilated with a 7 mm balloon.  Final imaging showed a widely patent fistula.  Cannulation sites were closed with a Monocryl.  Impression:  #1  Successful thrombectomy of occluded right upper arm dialysis graft and subsequent stenting of the venous outflow tract  #2  Graft is ready for immediate use and remains amenable to future interventions    V. Malvina New, M.D., [facility] Vascular and Vein Specialists of [place] Office: [phone] Pager:  [phone]

## 2023-12-07 ENCOUNTER — Encounter (HOSPITAL_COMMUNITY): Payer: Self-pay | Admitting: Surgery

## 2024-01-27 IMAGING — DX DG ELBOW COMPLETE 3+V*L*
4 series · 4 of 4 positions shown · non-contrast
Comparison: None Available.

CLINICAL DATA: Patient fell.  Left elbow pain.

EXAM:
LEFT ELBOW - COMPLETE 3+ VIEW

[elbow ap]
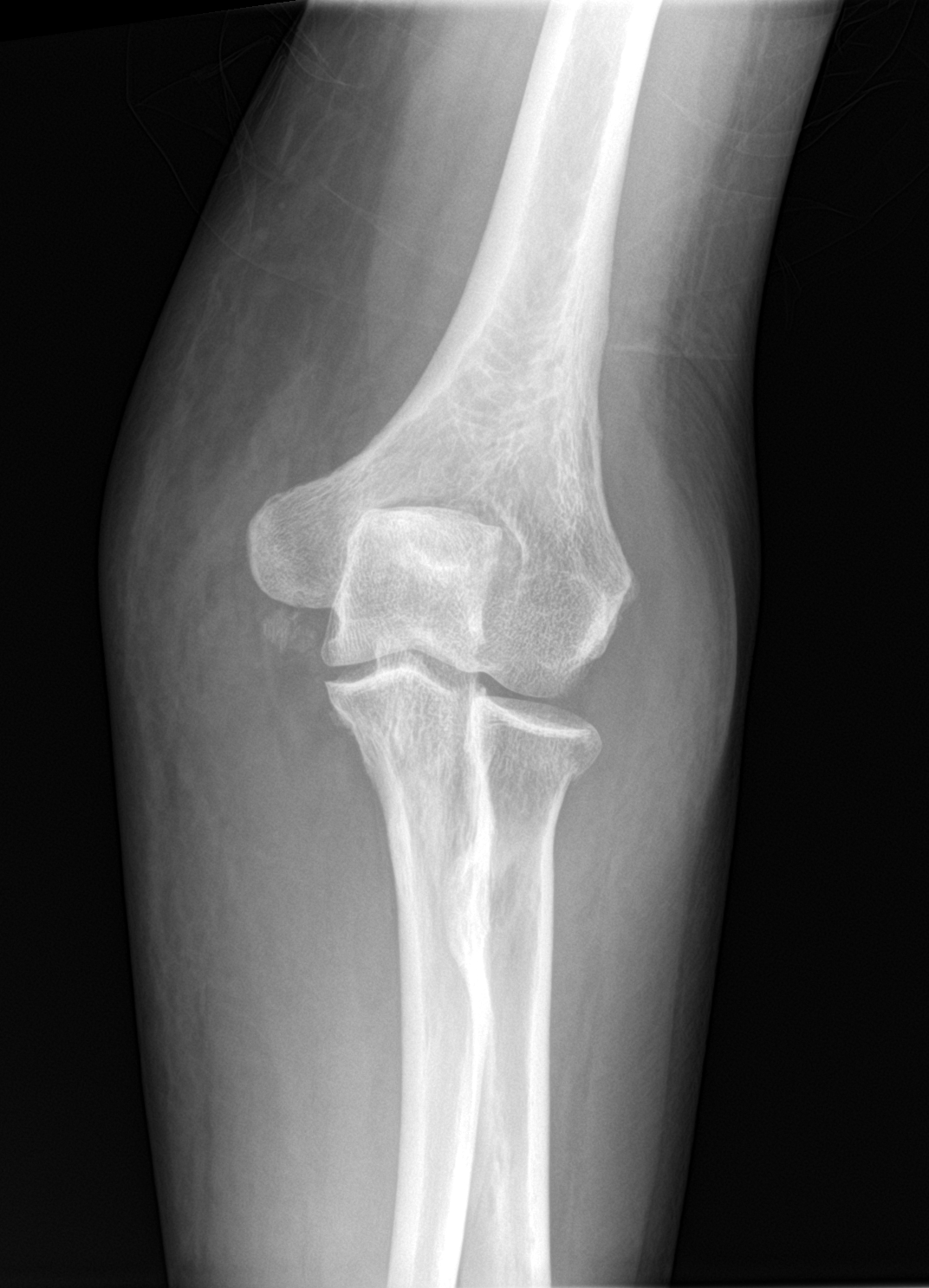

[elbow obl (1 of 2)]
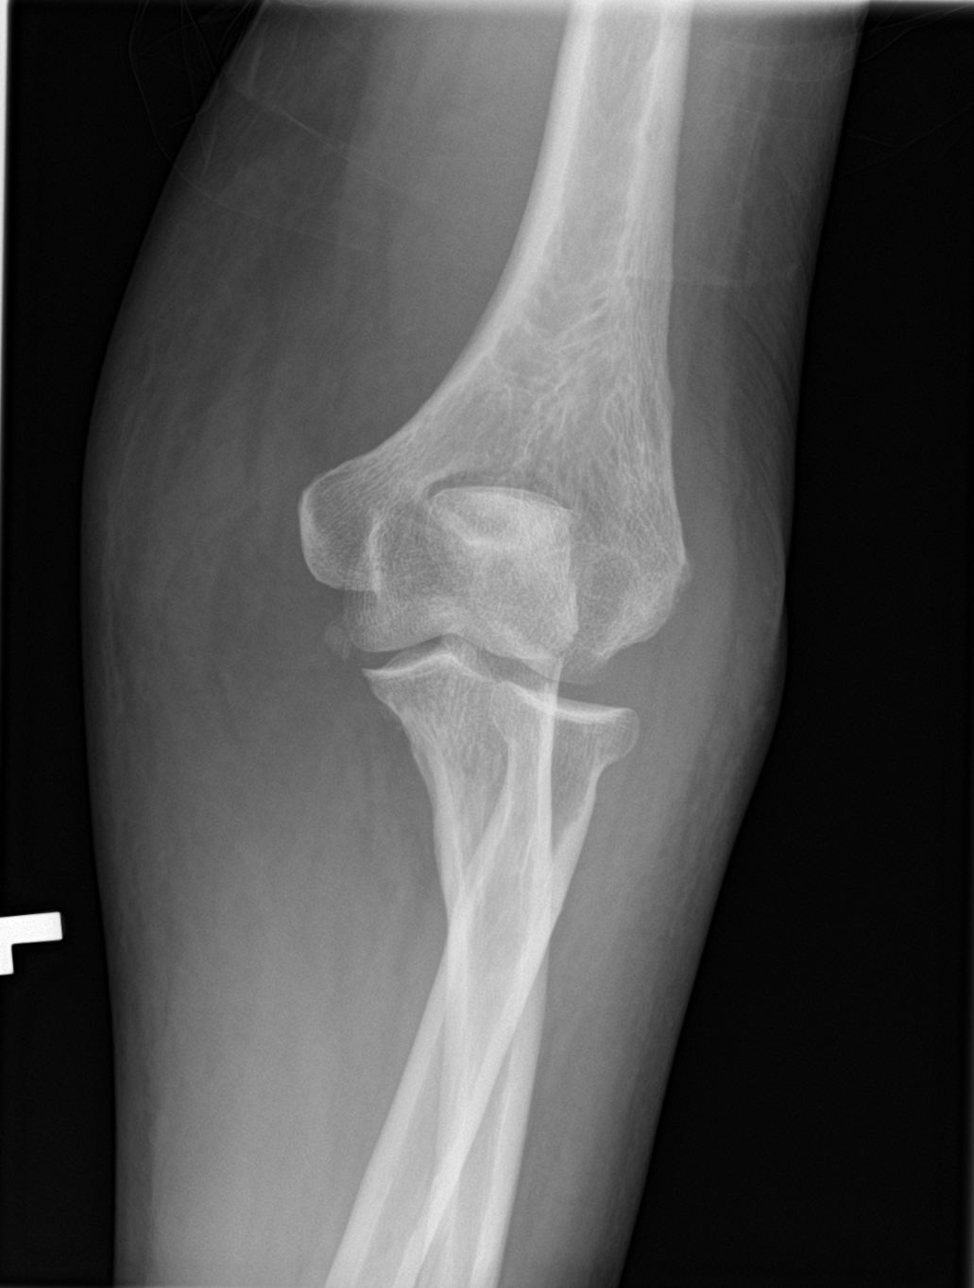

[elbow lat]
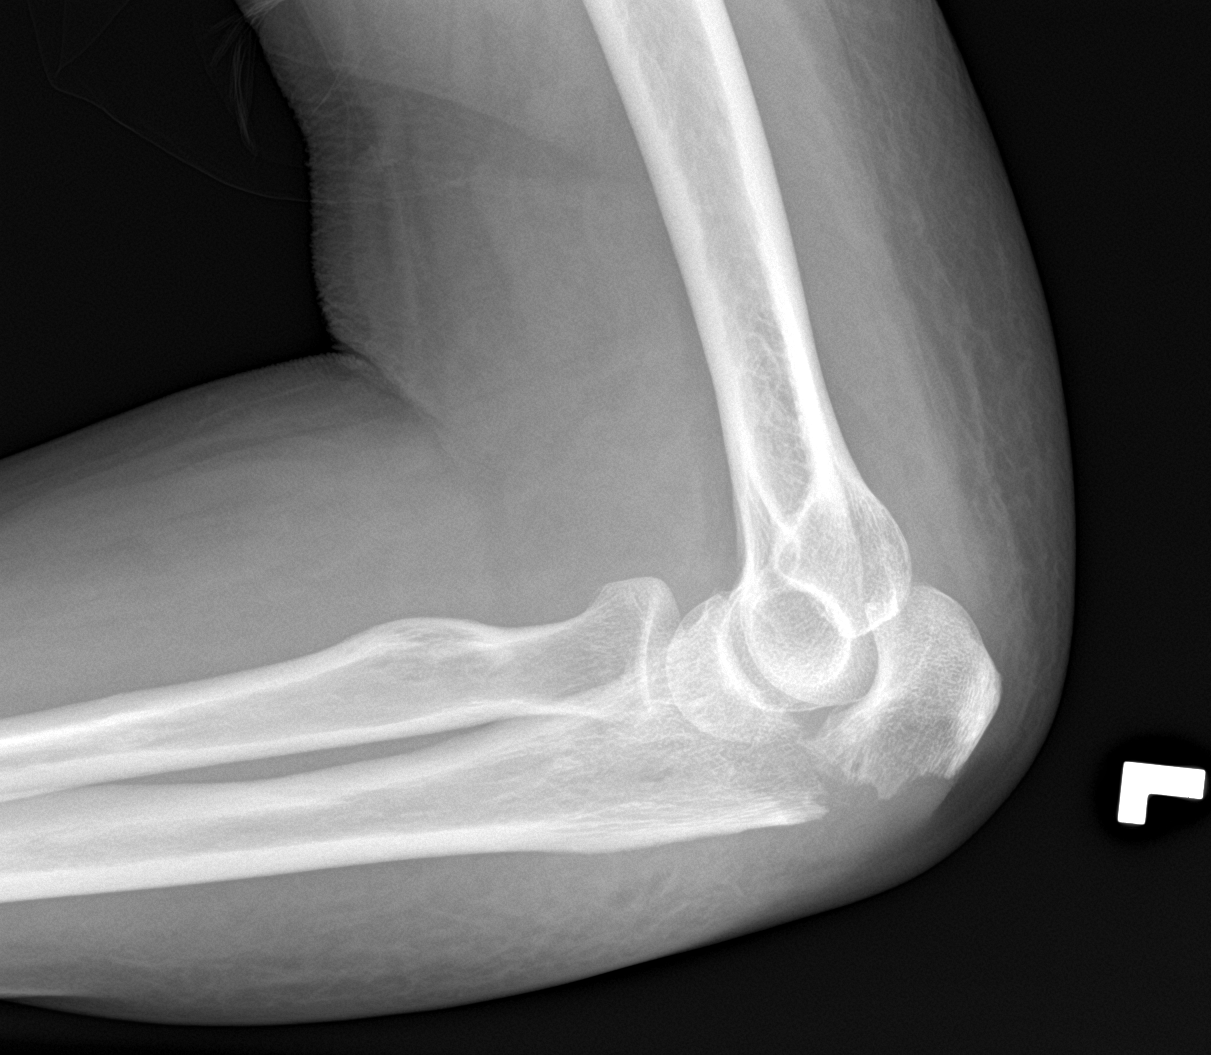

[elbow obl (2 of 2)]
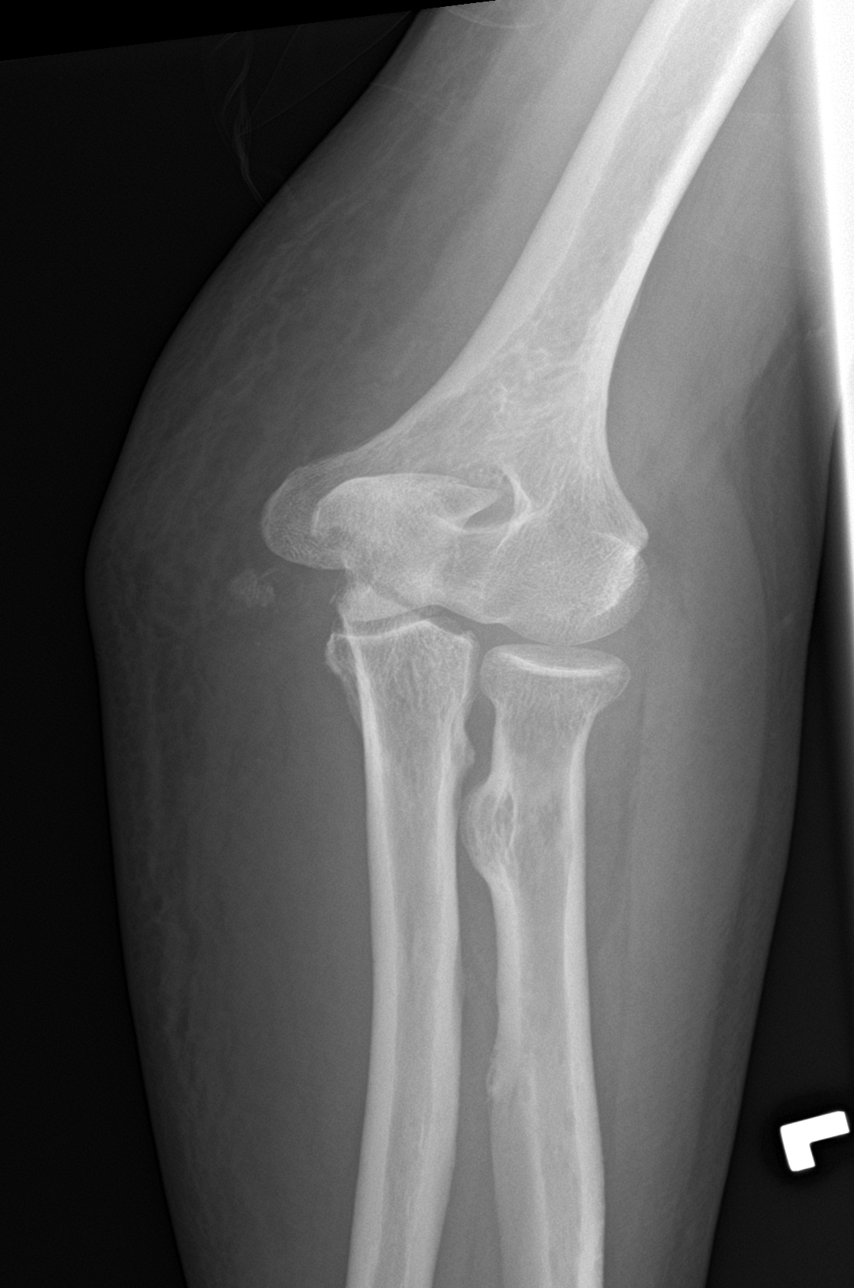

[4 of 4 positions shown; findings below may reference images not displayed]

FINDINGS: Four view study. Transverse fracture through the proximal ulna
results in electro non free fragment. AP and oblique view show
cortical fragment adjacent to the medial epicondyle, compatible with
an associated avulsion injury. Cortical off step in the radial neck
on the AP projection raises the question of a nondisplaced radial
neck fracture. No discernible fracture line within the articular
cortex of the radial head. Elevation of the anterior posterior fat
pads on the lateral projection is consistent with joint effusion.
IMPRESSION: 1. Transverse fracture through the proximal ulna at the joint
results in olecranon process as a free fragment. Associated joint
effusion evident.
2. Small cortical fragment adjacent to the medial epicondyle
suggests associated avulsion injury.
3. Question nondisplaced radial neck fracture.

## 2024-03-05 ENCOUNTER — Other Ambulatory Visit: Payer: Self-pay

## 2024-03-05 ENCOUNTER — Emergency Department (HOSPITAL_COMMUNITY)
Admission: EM | Admit: 2024-03-05 | Discharge: 2024-03-05 | Disposition: A | Discharge status: Home / Self Care | Attending: Emergency Medicine | Admitting: Emergency Medicine

## 2024-03-05 ENCOUNTER — Encounter (HOSPITAL_COMMUNITY): Payer: Self-pay

## 2024-03-05 DIAGNOSIS — N186 End stage renal disease: Secondary | ICD-10-CM | POA: Insufficient documentation

## 2024-03-05 DIAGNOSIS — T82868A Thrombosis of vascular prosthetic devices, implants and grafts, initial encounter: Secondary | ICD-10-CM | POA: Insufficient documentation

## 2024-03-05 DIAGNOSIS — Z992 Dependence on renal dialysis: Secondary | ICD-10-CM | POA: Diagnosis not present

## 2024-03-05 DIAGNOSIS — Y712 Prosthetic and other implants, materials and accessory cardiovascular devices associated with adverse incidents: Secondary | ICD-10-CM | POA: Diagnosis not present

## 2024-03-05 LAB — I-STAT CHEM 8, ED
BUN: 23 mg/dL (ref 8–23)
Calcium, Ion: 1.07 mmol/L — ABNORMAL LOW (ref 1.15–1.40)
Chloride: 99 mmol/L (ref 98–111)
Creatinine, Ser: 6.6 mg/dL — ABNORMAL HIGH (ref 0.61–1.24)
Glucose, Bld: 111 mg/dL — ABNORMAL HIGH (ref 70–99)
HCT: 41 % (ref 39.0–52.0)
Hemoglobin: 13.9 g/dL (ref 13.0–17.0)
Potassium: 4.5 mmol/L (ref 3.5–5.1)
Sodium: 138 mmol/L (ref 135–145)
TCO2: 30 mmol/L (ref 22–32)

## 2024-03-05 NOTE — Discharge Instructions (Addendum)
 Either interventional radiology or vascular surgery will contact you about having a procedure for the clot in your fistula on Monday or Tuesday.  If you do not hear back from them call Dr. Russ and Dr. Herminia office.  Return immediately to the emergency department if you start experiencing palpitations, muscle cramping, shortness of breath, leg swelling, or any other concerns.

## 2024-03-05 NOTE — ED Triage Notes (Signed)
 Pt came in via POV d/t his dialysis port being clogged this morning when he was being accessed for Tx at his center. States they tried twice & was unable to successfully access his fistula on the Rt arm. A/Ox4, denies any CP/SOB or any acute pain at this time.

## 2024-03-05 NOTE — ED Provider Notes (Signed)
 [facility] EMERGENCY DEPARTMENT AT [facility] Provider Note   CSN: [id number] Arrival date & time: 03/05/24  0800     Patient presents with: Vascular Access Problem   David Leblanc is a 73 y.o. male.   73 year old male with a history of ESRD on TTS HD who presents emergency department with difficulty with his fistula.  Patient reports that he has been getting dialysis without missing any sessions.  Went today and after multiple tries were unable to access his fistula.  Did have a thrombectomy on 8/15 of this year.  Says that he has not had any issues with it since.  Gets dialysis Tuesday, Thursday, Saturday at [facility] on [address]  No symptoms at this time.  Denies any bleeding, confusion, shortness of breath, or leg swelling.       Prior to Admission medications   Medication Sig Start Date End Date Taking? Authorizing Provider  DILT-XR 240 MG 24 hr capsule Take 240 mg by mouth daily. Take on Sun, Mon, Wed, and Fri (non-dialysis days) 12/15/21   [provider]  Multiple Vitamin (MULTIVITAMIN WITH MINERALS) TABS tablet Take 1 tablet by mouth in the morning.    [provider]  traZODone (DESYREL) 100 MG tablet Take 100 mg by mouth at bedtime as needed for sleep.    [provider]    Allergies: Patient has no known allergies.    Review of Systems  Updated Vital Signs BP (!) 143/69   Pulse 85   Temp 97.7 F (36.5 C)   Resp 18   Ht 5' 5.5 (1.664 m)   Wt 68 kg   SpO2 98%   BMI 24.57 kg/m   Physical Exam Constitutional:      Appearance: Normal appearance.  Eyes:     Extraocular Movements: Extraocular movements intact.     Conjunctiva/sclera: Conjunctivae normal.     Pupils: Pupils are equal, round, and reactive to light.  Cardiovascular:     Rate and Rhythm: Normal rate and regular rhythm.     Pulses: Normal pulses.  Pulmonary:     Effort: Pulmonary effort is normal.  Musculoskeletal:     Right lower leg: No edema.      Left lower leg: No edema.     Comments: Fistula in right upper extremity without bruit or thrill  Neurological:     Mental Status: He is alert.     (all labs ordered are listed, but only abnormal results are displayed) Labs Reviewed  I-STAT CHEM 8, ED - Abnormal; Notable for the following components:      Result Value   Creatinine, Ser 6.60 (*)    Glucose, Bld 111 (*)    Calcium, Ion 1.07 (*)    All other components within normal limits    EKG: None  Radiology: No results found.   Procedures   Medications Ordered in the ED - No data to display  Clinical Course as of 03/05/24 9077  Sat Mar 05, 2024  0901 Discussed with Dr. Sheree recommends reaching out to IR at this point in time [RP]  0907 Dr Luverne from IR consulted. They will reach out to the patient about scheduling him on Monday.  [RP]    Clinical Course User Index [RP] Yolande Lamar BROCKS, MD                                 Medical Decision Making  Adelard  Tesler is a 73 year old male with a history of ESRD on TTS HD who presents emergency department with difficulty with his fistula.   Initial Ddx:  Thrombosis, infection, electrolyte abnormality, volume overload  MDM/Course:  Patient presents to the emergency department with difficulty accessing his fistula today.  Appears to have been compliant with his dialysis and is asymptomatic at this time.  No bruit or thrill in his fistula and I suspect he has a thrombosis which she has had before.  Unfortunately we do not have vascular ultrasound at this point in time. Did discuss with vascular surgery and IR.  They will coordinate about having thrombectomy performed either Monday or Tuesday of next week.  Did check a BMP i-STAT which does not show hyperkalemia or any other concerning findings that would warrant emergent dialysis.  Upon re-evaluation patient is stable.  Updated on plan  This patient presents to the ED for concern of complaints listed in HPI, this involves  an extensive number of treatment options, and is a complaint that carries with it a high risk of complications and morbidity. Disposition including potential need for admission considered.   Dispo: DC Home. Return precautions discussed including, but not limited to, those listed in the AVS. Allowed pt time to ask questions which were answered fully prior to dc.  Additional history obtained from family Records reviewed operative note The following labs were independently interpreted: Chemistry and show CKD I have reviewed the patients home medications and made adjustments as needed Consults: Vascular Surgery and IR Social Determinants of health:  Geriatric  Portions of this note were generated with Scientist, clinical (histocompatibility and immunogenetics). Dictation errors may occur despite best attempts at proofreading.      Final diagnoses:  Thrombosis of arteriovenous fistula, initial encounter    ED Discharge Orders     None          Yolande Lamar BROCKS, MD 03/05/24 [phone]

## 2024-03-07 ENCOUNTER — Ambulatory Visit (HOSPITAL_COMMUNITY)
Admission: RE | Admit: 2024-03-07 | Discharge: 2024-03-07 | Disposition: A | Discharge status: Home / Self Care | Source: Ambulatory Visit | Attending: Interventional Radiology | Admitting: Interventional Radiology

## 2024-03-07 ENCOUNTER — Other Ambulatory Visit (HOSPITAL_COMMUNITY): Payer: Self-pay | Admitting: Radiology

## 2024-03-07 ENCOUNTER — Other Ambulatory Visit (HOSPITAL_COMMUNITY): Payer: Self-pay | Admitting: Interventional Radiology

## 2024-03-07 ENCOUNTER — Other Ambulatory Visit: Payer: Self-pay

## 2024-03-07 DIAGNOSIS — N186 End stage renal disease: Secondary | ICD-10-CM

## 2024-03-07 DIAGNOSIS — Z992 Dependence on renal dialysis: Secondary | ICD-10-CM | POA: Diagnosis not present

## 2024-03-07 DIAGNOSIS — I120 Hypertensive chronic kidney disease with stage 5 chronic kidney disease or end stage renal disease: Secondary | ICD-10-CM | POA: Insufficient documentation

## 2024-03-07 DIAGNOSIS — Z87891 Personal history of nicotine dependence: Secondary | ICD-10-CM | POA: Diagnosis not present

## 2024-03-07 DIAGNOSIS — T82868A Thrombosis of vascular prosthetic devices, implants and grafts, initial encounter: Secondary | ICD-10-CM | POA: Diagnosis present

## 2024-03-07 DIAGNOSIS — Y832 Surgical operation with anastomosis, bypass or graft as the cause of abnormal reaction of the patient, or of later complication, without mention of misadventure at the time of the procedure: Secondary | ICD-10-CM | POA: Insufficient documentation

## 2024-03-07 HISTORY — PX: IR THROMBECTOMY AV FISTULA W/THROMBOLYSIS/PTA INC/SHUNT/IMG RIGHT: IMG6119

## 2024-03-07 LAB — POCT ACTIVATED CLOTTING TIME
Activated Clotting Time: 245 s
Activated Clotting Time: 182 s

## 2024-03-07 MED ORDER — FENTANYL CITRATE (PF) 100 MCG/2ML IJ SOLN
INTRAMUSCULAR | Status: AC | PRN
  Administered 2024-03-07 (×3): 25 ug via INTRAVENOUS

## 2024-03-07 MED ORDER — IOHEXOL 300 MG/ML  SOLN
100.0000 mL | Freq: Once | INTRAMUSCULAR | Status: AC | PRN
  Administered 2024-03-07: 60 mL via INTRAVENOUS

## 2024-03-07 MED ORDER — HEPARIN SODIUM (PORCINE) 1000 UNIT/ML IJ SOLN
INTRAMUSCULAR | Status: AC
  Filled 2024-03-07: qty 10

## 2024-03-07 MED ORDER — MIDAZOLAM HCL (PF) 2 MG/2ML IJ SOLN
INTRAMUSCULAR | Status: AC | PRN
  Administered 2024-03-07 (×3): .5 mg via INTRAVENOUS

## 2024-03-07 MED ORDER — LIDOCAINE-EPINEPHRINE 1 %-1:100000 IJ SOLN
INTRAMUSCULAR | Status: AC
  Filled 2024-03-07: qty 1

## 2024-03-07 MED ORDER — SODIUM CHLORIDE 0.9 % IV SOLN: INTRAVENOUS | Status: DC

## 2024-03-07 MED ORDER — HEPARIN SODIUM (PORCINE) 1000 UNIT/ML IJ SOLN
INTRAMUSCULAR | Status: AC | PRN
  Administered 2024-03-07: 4000 [IU] via INTRAVENOUS

## 2024-03-07 MED ORDER — FENTANYL CITRATE (PF) 100 MCG/2ML IJ SOLN
INTRAMUSCULAR | Status: AC
  Filled 2024-03-07: qty 2

## 2024-03-07 MED ORDER — MIDAZOLAM HCL 2 MG/2ML IJ SOLN
INTRAMUSCULAR | Status: AC
  Filled 2024-03-07: qty 2

## 2024-03-07 MED ORDER — IOHEXOL 300 MG/ML  SOLN
100.0000 mL | Freq: Once | INTRAMUSCULAR | Status: AC | PRN
  Administered 2024-03-07: 20 mL via INTRA_ARTERIAL

## 2024-03-07 MED ORDER — LIDOCAINE-EPINEPHRINE 1 %-1:100000 IJ SOLN
20.0000 mL | Freq: Once | INTRAMUSCULAR | Status: AC
  Administered 2024-03-07: 10 mL via INTRADERMAL

## 2024-03-07 NOTE — Progress Notes (Signed)
 Interventional Radiology Brief Note:  APP to bedside for removal of flowstasis device.  Device removed and no bleeding observed x10 seconds. Pursestring then removed with no further bleeding noted. Placed dressing on site with immediate saturation at the superior venous access site.  Inferior puncture site remained stable.  Pressure held x10 minutes.  Dr. Jenna to bedside with IR tech for pressure dressing.  Pressure dressing placed and observed x5 minutes with no new bleeding.  Dr. Jenna provided updated discharge instructions to keep an additional 2 hrs.  At 2 hr mark, remove pressure dressing and assess.  If new bleeding observed, call IR MD on call for further instructions not limited to dressing replacement and/or possible admission.   Patient and family present for all exam and instructions.  Verbalize understanding.   Udell Mazzocco, MS RD PA-C

## 2024-03-07 NOTE — Progress Notes (Signed)
 Sutures removed by PA, while removing site started to have significant amount of bleeding pressure held by PA, RN at bedside to assist when needed. MD babcock to bedside to look at site, pressure dressing placed by PA, per MD babcock patient is to stay another 2 hours after bleeding has stopped (1701), RN to carefully monitor site for bleeding at this time and update MD as needed.

## 2024-03-07 NOTE — Procedures (Signed)
 Interventional Radiology Procedure Note  Procedure: AVF declot  Complications: None  Estimated Blood Loss: < 10 mL  Findings: Non occlusive thrombus in AVF in RUE.  Spyder wire 7mm deployed after anticoagulation.  Balloon thrombectomy and angioplasty of venous outflow.  Cordella DELENA Banner, MD

## 2024-03-07 NOTE — Sedation Documentation (Signed)
 ACT- 182

## 2024-03-07 NOTE — Sedation Documentation (Signed)
 ACT @ 245

## 2024-03-07 NOTE — Progress Notes (Signed)
 Handoff given to [facility].

## 2024-03-07 NOTE — H&P (Signed)
 Chief Complaint: Unable to cannulate Right Upper Extremity AV Graft. Request is for fistulogram with possible intervention   Referring Physician(s): Dr, Lamar Shan  Supervising Physician: Jenna Hacker  Patient Status: [facility] - Out-pt  History of Present Illness: David Leblanc is a 73 y.o. male  inpatient. History of HTN, ESRD on HD via a right upper arm AV Gortex Graft placed on 2.2.24. Per EPIC a 8 X 5 viabahn stent  and 7 mm mustang balloon angioplasty was placed on 1.15.25 and a thrombectomy on 8.15.25 using a 7 x 80 balloon. Patient has a previous left AV fistula that is no longer functional. Team states that they are unable to access the graft during dialysis. Patient presents for fistulogram and possible intervention.    Wife at bedside. Currently without any significant complaints. Patient alert and laying in bed,calm. Denies any fevers, headache, chest pain, SOB, cough, abdominal pain, nausea, vomiting or bleeding.   Labs and medications are within acceptable parameters. NKDA. Patient has been NPO since 6 am.   Return precautions and treatment recommendations and follow-up discussed with the patient and his wife. Both  who are agreeable with the plan.   Past Medical History:  Diagnosis Date   Anemia    Chronic kidney disease    T-TH-S   ESRD (end stage renal disease) (HCC)    Hypertension    Tobacco use     Past Surgical History:  Procedure Laterality Date   A/V FISTULAGRAM Left 05/06/2022   Procedure: A/V Fistulagram;  Surgeon: Serene Gaile ORN, MD;  Location: [facility] INVASIVE CV LAB;  Service: Cardiovascular;  Laterality: Left;   A/V FISTULAGRAM N/A 05/06/2023   Procedure: A/V Fistulagram;  Surgeon: Melia Lynwood ORN, MD;  Location: [facility] INVASIVE CV LAB;  Service: Cardiovascular;  Laterality: N/A;   AV FISTULA PLACEMENT Left 01/17/2022   Procedure: LEFT ARTERIOVENOUS (AV) FISTULA CREATION;  Surgeon: Serene Gaile ORN, MD;  Location: [facility] OR;  Service: Vascular;   Laterality: Left;   EYE SURGERY     LIGATION ARTERIOVENOUS GORTEX GRAFT Right 05/23/2022   Procedure: INSERTION OF RIGHT ARM ARTERIOVENOUS GORTEX GRAFT;  Surgeon: Serene Gaile ORN, MD;  Location: [facility] OR;  Service: Vascular;  Laterality: Right;   NO PAST SURGERIES     PERIPHERAL VASCULAR INTERVENTION  05/06/2023   Procedure: PERIPHERAL VASCULAR INTERVENTION;  Surgeon: Melia Lynwood ORN, MD;  Location: [facility] INVASIVE CV LAB;  Service: Cardiovascular;;   PERIPHERAL VASCULAR THROMBECTOMY Right 12/04/2023   Procedure: PERIPHERAL VASCULAR THROMBECTOMY;  Surgeon: Serene Gaile ORN, MD;  Location: [facility] PV LAB;  Service: Cardiovascular;  Laterality: Right;   VENOUS STENT  12/04/2023   Procedure: VENOUS STENT;  Surgeon: Serene Gaile ORN, MD;  Location: [facility] LAB;  Service: Cardiovascular;;    Allergies: Patient has no known allergies.  Medications: Prior to Admission medications   Medication Sig Start Date End Date Taking? Authorizing Provider  DILT-XR 240 MG 24 hr capsule Take 240 mg by mouth daily. Take on Sun, Mon, Wed, and Fri (non-dialysis days) 12/15/21   [provider]  Multiple Vitamin (MULTIVITAMIN WITH MINERALS) TABS tablet Take 1 tablet by mouth in the morning.    [provider]  traZODone (DESYREL) 100 MG tablet Take 100 mg by mouth at bedtime as needed for sleep.    [provider]     Family History  Problem Relation Age of Onset   Hypertension Father     Social History   Socioeconomic History   Marital status: Married  Spouse name: Not on file   Number of children: 3   Years of education: Not on file   Highest education level: Not on file  Occupational History   Not on file  Tobacco Use   Smoking status: Former    Current packs/day: 0.00    Average packs/day: 0.3 packs/day for 40.0 years (10.0 ttl pk-yrs)    Types: Cigarettes    Start date: 09/02/1981    Quit date: 09/02/2021    Years since quitting: 2.5    Passive exposure: Never   Smokeless  tobacco: Never  Vaping Use   Vaping status: Never Used  Substance and Sexual Activity   Alcohol use: Not Currently    Comment: 2 shots per day   Drug use: Never   Sexual activity: Not on file  Other Topics Concern   Not on file  Social History Narrative   Not on file   Social Drivers of Health   Financial Resource Strain: Not on file  Food Insecurity: Not on file  Transportation Needs: Not on file  Physical Activity: Not on file  Stress: Not on file  Social Connections: Not on file      Review of Systems: A 12 point ROS discussed and pertinent positives are indicated in the HPI above.  All other systems are negative.  Review of Systems  Constitutional:  Negative for fever.  HENT:  Negative for congestion.   Respiratory:  Negative for cough and shortness of breath.   Cardiovascular:  Negative for chest pain.  Gastrointestinal:  Negative for abdominal pain.  Neurological:  Negative for headaches.  Psychiatric/Behavioral:  Negative for behavioral problems and confusion.     Vital Signs: BP (!) 174/94   Pulse (!) 101   Temp 98.2 F (36.8 C) (Oral)   Resp 17   Ht 5' 6 (1.676 m)   Wt 148 lb (67.1 kg)   SpO2 98%   BMI 23.89 kg/m   Advance Care Plan: The advanced care plan/surrogate decision maker was discussed at the time of visit and the patient did not wish to discuss or was not able to name a surrogate decision maker or provide an advance care plan.    Physical Exam Vitals and nursing note reviewed.  Constitutional:      Appearance: He is well-developed.  HENT:     Head: Normocephalic.     Mouth/Throat:     Mouth: Mucous membranes are dry.  Cardiovascular:     Rate and Rhythm: Tachycardia present.     Comments: Right upper arm +/+ Pulmonary:     Effort: Pulmonary effort is normal.  Musculoskeletal:        General: Normal range of motion.     Cervical back: Normal range of motion.  Skin:    General: Skin is warm and dry.  Neurological:     General:  No focal deficit present.     Mental Status: He is alert and oriented to person, place, and time. Mental status is at baseline.  Psychiatric:        Mood and Affect: Mood normal.        Behavior: Behavior normal.        Thought Content: Thought content normal.        Judgment: Judgment normal.     Imaging: No results found.  Labs:  CBC: Recent Labs    03/05/24 0913  HGB 13.9  HCT 41.0    COAGS: No results for input(s): INR, APTT in the last  8760 hours.  BMP: Recent Labs    03/05/24 0913  NA 138  K 4.5  CL 99  GLUCOSE 111*  BUN 23  CREATININE 6.60*    LIVER FUNCTION TESTS: No results for input(s): BILITOT, AST, ALT, ALKPHOS, PROT, ALBUMIN in the last 8760 hours.  TUMOR MARKERS: No results for input(s): AFPTM, CEA, CA199, CHROMGRNA in the last 8760 hours.  Assessment and Plan:  72 y.o. male inpatient. History of HTN, ESRD on HD via a right upper arm AV Gortex Graft placed on 2.2.24. Per EPIC a 8 X 5 viabahn stent  and 7 mm mustang balloon angioplasty was placed on 1.15.25 and a thrombectomy on 8.15.25 using a 7 x 80 balloon. Patient has a previous left AV fistula that is no longer functional. Team states that they are unable to access the graft during dialysis. Patient presents for fistulogram and possible intervention.    PLAN: IR Image Guided  Fistulogram with possible intervention  Risks and benefits discussed with the patient including, but not limited to bleeding, infection, vascular injury, pneumothorax which may require chest tube placement, air embolism or even death  All of the patient's questions were answered, patient is agreeable to proceed. Consent signed and in chart.  Thank you for this interesting consult.  I greatly enjoyed meeting Hillary Brookshire and look forward to participating in their care.  A copy of this report was sent to the requesting provider on this date.  Electronically Signed: Delon JAYSON Beagle,  NP 03/07/2024, 12:21 PM   I spent a total of  30 Minutes   in face to face in clinical consultation, greater than 50% of which was counseling/coordinating care for fistulogram possible intervention.

## 2024-03-08 ENCOUNTER — Other Ambulatory Visit (HOSPITAL_COMMUNITY): Payer: Self-pay | Admitting: Interventional Radiology

## 2024-03-08 DIAGNOSIS — N186 End stage renal disease: Secondary | ICD-10-CM

## 2024-03-08 HISTORY — PX: IR US GUIDE VASC ACCESS RIGHT: IMG2390
